# Patient Record
Sex: Male | Born: 1990 | Race: White | Hispanic: No | Marital: Single | Smoking: Current every day smoker
Health system: Southern US, Community
[De-identification: ages and names within clinical notes are randomized; demographics above are authoritative.]

## PROBLEM LIST (undated history)

## (undated) DIAGNOSIS — F1910 Other psychoactive substance abuse, uncomplicated: Secondary | ICD-10-CM

## (undated) DIAGNOSIS — N200 Calculus of kidney: Secondary | ICD-10-CM

## (undated) DIAGNOSIS — F988 Other specified behavioral and emotional disorders with onset usually occurring in childhood and adolescence: Secondary | ICD-10-CM

## (undated) DIAGNOSIS — F419 Anxiety disorder, unspecified: Secondary | ICD-10-CM

## (undated) DIAGNOSIS — F32A Depression, unspecified: Secondary | ICD-10-CM

## (undated) DIAGNOSIS — F329 Major depressive disorder, single episode, unspecified: Secondary | ICD-10-CM

## (undated) DIAGNOSIS — N289 Disorder of kidney and ureter, unspecified: Secondary | ICD-10-CM

## (undated) HISTORY — PX: DENTAL SURGERY: SHX609

## (undated) HISTORY — PX: OTHER SURGICAL HISTORY: SHX169

## (undated) HISTORY — DX: Other specified behavioral and emotional disorders with onset usually occurring in childhood and adolescence: F98.8

---
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
## 2009-06-16 ENCOUNTER — Emergency Department (HOSPITAL_BASED_OUTPATIENT_CLINIC_OR_DEPARTMENT_OTHER)
Admission: EM | Admit: 2009-06-16 | Discharge: 2009-06-16 | Payer: Self-pay | Source: Home / Self Care | Admitting: Emergency Medicine

## 2009-06-16 ENCOUNTER — Ambulatory Visit: Payer: Self-pay | Admitting: Diagnostic Radiology

## 2009-06-18 ENCOUNTER — Ambulatory Visit: Payer: Self-pay | Admitting: Pulmonary Disease

## 2009-06-18 DIAGNOSIS — J309 Allergic rhinitis, unspecified: Secondary | ICD-10-CM | POA: Insufficient documentation

## 2009-06-18 DIAGNOSIS — I10 Essential (primary) hypertension: Secondary | ICD-10-CM | POA: Insufficient documentation

## 2009-06-18 DIAGNOSIS — F17200 Nicotine dependence, unspecified, uncomplicated: Secondary | ICD-10-CM | POA: Insufficient documentation

## 2009-06-18 DIAGNOSIS — R042 Hemoptysis: Secondary | ICD-10-CM | POA: Insufficient documentation

## 2009-06-19 DIAGNOSIS — F411 Generalized anxiety disorder: Secondary | ICD-10-CM

## 2009-06-20 ENCOUNTER — Ambulatory Visit: Payer: Self-pay | Admitting: Internal Medicine

## 2009-10-28 ENCOUNTER — Emergency Department (HOSPITAL_COMMUNITY)
Admission: EM | Admit: 2009-10-28 | Discharge: 2009-10-28 | Payer: Self-pay | Source: Home / Self Care | Admitting: Emergency Medicine

## 2009-11-12 DEATH — deceased

## 2010-02-02 ENCOUNTER — Encounter: Payer: Self-pay | Admitting: Pulmonary Disease

## 2010-02-11 NOTE — Assessment & Plan Note (Signed)
Summary: hemoptysis/self ref./apc   Visit Type:  Initial Consult Copy to:  ER Dr. Nira Conn Primary Provider/Referring Provider:  Dr. Foy Guadalajara  CC:  Pt c/o coughing up blood x 3 days, PND, nasal congestion, and SOB with exertion.  History of Present Illness: 18/M smoker for evaluation of hemoptysis. He smokes marijuana & 2 PPd. He developed sudden onset bloodt inged sputum , the frank hemoptysis about 5-10 cc on two occasions on 06/16/09. ER visit, CXR was nml. Labs showed WC of 8.9, Hb 15.8, PT 12.7, BMET nml. He denies drug usage prior , denies bleeding gums or epistaxis. He does not have a h/o hematemesis or malena. He reports 1 episode of bloo din stool. His father dies of an MI at age 46, mother had an MI a few weeks ago.   Preventive Screening-Counseling & Management  Alcohol-Tobacco     Alcohol drinks/day: 1     Alcohol type: all     Smoking Status: current     Packs/Day: 2.0     Year Started: 2006     Year Quit: 2011  Current Medications (verified): 1)  Tessalon Perles 100 Mg Caps (Benzonatate) .... Take 1 Tablet By Mouth Two Times A Day 2)  Zpak .... As Directed 3)  Klonopin 1 Mg Tabs (Clonazepam) .... Take 1 Tablet By Mouth Two Times A Day and As Needed  Allergies (verified): 1)  ! Ceclor 2)  ! * Latex  Past History:  Past Medical History: Allergic Rhinitis Hypertension  Family History: Family History MI/Heart Attack-mother and father Father deceased age 60  Social History: Marital Status: single lives with mother Children: no Occupation:unemployeed  Patient is a current smoker.  Marijuana useSmoking Status:  current Packs/Day:  2.0 Alcohol drinks/day:  1  Review of Systems       The patient complains of shortness of breath with activity, productive cough, non-productive cough, coughing up blood, chest pain, acid heartburn, indigestion, abdominal pain, headaches, nasal congestion/difficulty breathing through nose, anxiety, and joint stiffness or pain.  The  patient denies shortness of breath at rest, irregular heartbeats, loss of appetite, weight change, difficulty swallowing, sore throat, tooth/dental problems, sneezing, itching, ear ache, depression, hand/feet swelling, rash, change in color of mucus, and fever.    Vital Signs:  Patient profile:   20 year old male Height:      73 inches Weight:      214 pounds BMI:     28.34 O2 Sat:      97 % on Room air Temp:     98.2 degrees F oral Pulse rate:   80 / minute BP sitting:   114 / 84  (left arm) Cuff size:   regular  Vitals Entered By: Zackery Barefoot CMA (June 18, 2009 11:41 AM)  O2 Flow:  Room air CC: Pt c/o coughing up blood x 3 days, PND, nasal congestion, SOB with exertion Comments Medications reviewed with patient Verified contact number and pharmacy with patient Zackery Barefoot CMA  June 18, 2009 11:42 AM    Physical Exam  Additional Exam:  Gen. Pleasant, well-nourished, in no distress, normal affect, ear piercings, tattoos ENT - no lesions, no post nasal drip Neck: No JVD, no thyromegaly, no carotid bruits Lungs: no use of accessory muscles, no dullness to percussion, clear without rales or rhonchi  Cardiovascular: Rhythm regular, heart sounds  normal, no murmurs or gallops, no peripheral edema Abdomen: soft and non-tender, no hepatosplenomegaly, BS normal. Musculoskeletal: No deformities, no cyanosis or clubbing Neuro:  alert, non focal     Impression & Recommendations:  Problem # 1:  HEMOPTYSIS (ICD-786.3) Assessment New No clear preceding URI/ bronchitic symptoms in this heavy smoker. CXR was nml but needs CT scan to r/o endobronchial pathology. Doubt cancer but benign lesions possible. If negative, can be attributed to smoking. Orders: Consultation Level III [id number]) Radiology Referral (Radiology)  Problem # 2:  TOBACCO ABUSE (ICD-305.1) Homero Fellers discussion about cessation.  He does not need aids such as patches or chanticx.  Medications Added to Medication  List This Visit: 1)  Tessalon Perles 100 Mg Caps (Benzonatate) .... Take 1 tablet by mouth two times a day 2)  Zpak  .... As directed 3)  Klonopin 1 Mg Tabs (Clonazepam) .... Take 1 tablet by mouth two times a day and as needed  Patient Instructions: 1)  Copy sent to: dr fried 2)  Please schedule a follow-up appointment as needed. 3)  A Chest CT WITHOUT Contrast has been recommended. Your imaging study may require preauthorization.

## 2010-03-26 LAB — POCT CARDIAC MARKERS
CKMB, poc: 1.3 ng/mL (ref 1.0–8.0)
Myoglobin, poc: 40.4 ng/mL (ref 12–200)
Troponin i, poc: <0.05 ng/mL (ref 0.00–0.09)
Troponin i, poc: <0.05 ng/mL (ref 0.00–0.09)

## 2010-03-26 LAB — BASIC METABOLIC PANEL
BUN: 15 mg/dL (ref 6–23)
Creatinine, Ser: 0.89 mg/dL (ref 0.4–1.5)
GFR calc Af Amer: >60 mL/min (ref >60)
GFR calc non Af Amer: >60 mL/min (ref >60)
Potassium: 4.4 mEq/L (ref 3.5–5.1)

## 2010-03-26 LAB — DIFFERENTIAL
Basophils Absolute: 0 10*3/uL (ref 0.0–0.1)
Lymphocytes Relative: 24 % (ref 12–46)
Lymphs Abs: 2.8 10*3/uL (ref 0.7–4.0)
Neutro Abs: 8.4 10*3/uL — ABNORMAL HIGH (ref 1.7–7.7)

## 2010-03-26 LAB — CBC
Platelets: 243 10*3/uL (ref 150–400)
RBC: 5.12 MIL/uL (ref 4.22–5.81)
RDW: 12.5 % (ref 11.5–15.5)
WBC: 11.9 10*3/uL — ABNORMAL HIGH (ref 4.0–10.5)

## 2010-03-31 LAB — DIFFERENTIAL
Eosinophils Absolute: 0.2 10*3/uL (ref 0.0–0.7)
Eosinophils Relative: 2 % (ref 0–5)
Lymphs Abs: 3 10*3/uL (ref 0.7–4.0)

## 2010-03-31 LAB — CBC
HCT: 46.5 % (ref 39.0–52.0)
MCHC: 34 g/dL (ref 30.0–36.0)
MCV: 89.7 fL (ref 78.0–100.0)
Platelets: 225 10*3/uL (ref 150–400)
WBC: 8.9 10*3/uL (ref 4.0–10.5)

## 2010-03-31 LAB — BASIC METABOLIC PANEL
BUN: 13 mg/dL (ref 6–23)
CO2: 25 mEq/L (ref 19–32)
Chloride: 109 mEq/L (ref 96–112)
Glucose, Bld: 96 mg/dL (ref 70–99)
Potassium: 4.6 mEq/L (ref 3.5–5.1)

## 2010-03-31 LAB — PROTIME-INR: Prothrombin Time: 12.7 seconds (ref 11.6–15.2)

## 2013-08-17 ENCOUNTER — Other Ambulatory Visit: Payer: Self-pay | Admitting: Orthopaedic Surgery

## 2013-08-17 DIAGNOSIS — M545 Low back pain, unspecified: Secondary | ICD-10-CM

## 2013-08-18 ENCOUNTER — Ambulatory Visit: Payer: Self-pay | Admitting: Family Medicine

## 2013-08-18 DIAGNOSIS — Z0289 Encounter for other administrative examinations: Secondary | ICD-10-CM

## 2013-08-19 ENCOUNTER — Ambulatory Visit
Admission: RE | Admit: 2013-08-19 | Discharge: 2013-08-19 | Disposition: A | Discharge status: Home / Self Care | Payer: BC Managed Care – PPO | Source: Ambulatory Visit | Attending: Orthopaedic Surgery | Admitting: Orthopaedic Surgery

## 2013-08-19 DIAGNOSIS — M545 Low back pain, unspecified: Secondary | ICD-10-CM

## 2013-08-22 ENCOUNTER — Telehealth: Payer: Self-pay | Admitting: Family Medicine

## 2013-08-22 NOTE — Telephone Encounter (Signed)
Noted  

## 2013-08-22 NOTE — Telephone Encounter (Signed)
Patient no showed for new pt appt on 8/7.  Please advise.

## 2013-11-16 ENCOUNTER — Emergency Department (HOSPITAL_COMMUNITY)
Admission: EM | Admit: 2013-11-16 | Discharge: 2013-11-16 | Disposition: A | Discharge status: Home / Self Care | Payer: BC Managed Care – PPO | Attending: Emergency Medicine | Admitting: Emergency Medicine

## 2013-11-16 ENCOUNTER — Encounter (HOSPITAL_COMMUNITY): Payer: Self-pay

## 2013-11-16 DIAGNOSIS — Z91040 Latex allergy status: Secondary | ICD-10-CM | POA: Diagnosis not present

## 2013-11-16 DIAGNOSIS — F32A Depression, unspecified: Secondary | ICD-10-CM

## 2013-11-16 DIAGNOSIS — F909 Attention-deficit hyperactivity disorder, unspecified type: Secondary | ICD-10-CM | POA: Insufficient documentation

## 2013-11-16 DIAGNOSIS — F329 Major depressive disorder, single episode, unspecified: Secondary | ICD-10-CM | POA: Insufficient documentation

## 2013-11-16 DIAGNOSIS — Z720 Tobacco use: Secondary | ICD-10-CM | POA: Insufficient documentation

## 2013-11-16 DIAGNOSIS — F419 Anxiety disorder, unspecified: Secondary | ICD-10-CM | POA: Diagnosis not present

## 2013-11-16 DIAGNOSIS — Z79899 Other long term (current) drug therapy: Secondary | ICD-10-CM | POA: Insufficient documentation

## 2013-11-16 HISTORY — DX: Depression, unspecified: F32.A

## 2013-11-16 HISTORY — DX: Major depressive disorder, single episode, unspecified: F32.9

## 2013-11-16 HISTORY — DX: Disorder of kidney and ureter, unspecified: N28.9

## 2013-11-16 HISTORY — DX: Anxiety disorder, unspecified: F41.9

## 2013-11-16 LAB — COMPREHENSIVE METABOLIC PANEL
ALBUMIN: 4 g/dL (ref 3.5–5.2)
ALK PHOS: 82 U/L (ref 39–117)
ALT: 15 U/L (ref 0–53)
AST: 14 U/L (ref 0–37)
Anion gap: 12 (ref 5–15)
BUN: 19 mg/dL (ref 6–23)
CALCIUM: 9.7 mg/dL (ref 8.4–10.5)
CO2: 25 mEq/L (ref 19–32)
Chloride: 104 mEq/L (ref 96–112)
Creatinine, Ser: 0.95 mg/dL (ref 0.50–1.35)
GFR calc Af Amer: >90 mL/min (ref >90)
GFR calc non Af Amer: >90 mL/min (ref >90)
GLUCOSE: 121 mg/dL — AB (ref 70–99)
POTASSIUM: 4.4 meq/L (ref 3.7–5.3)
SODIUM: 141 meq/L (ref 137–147)
TOTAL PROTEIN: 7.6 g/dL (ref 6.0–8.3)
Total Bilirubin: 0.4 mg/dL (ref 0.3–1.2)

## 2013-11-16 LAB — CBC
HCT: 41.7 % (ref 39.0–52.0)
HEMOGLOBIN: 14.2 g/dL (ref 13.0–17.0)
MCH: 30.3 pg (ref 26.0–34.0)
MCHC: 34.1 g/dL (ref 30.0–36.0)
MCV: 88.9 fL (ref 78.0–100.0)
PLATELETS: 229 10*3/uL (ref 150–400)
RBC: 4.69 MIL/uL (ref 4.22–5.81)
RDW: 13.1 % (ref 11.5–15.5)
WBC: 8.8 10*3/uL (ref 4.0–10.5)

## 2013-11-16 LAB — RAPID URINE DRUG SCREEN, HOSP PERFORMED
AMPHETAMINES: NOT DETECTED
BENZODIAZEPINES: POSITIVE — AB
Barbiturates: NOT DETECTED
COCAINE: NOT DETECTED
OPIATES: POSITIVE — AB
TETRAHYDROCANNABINOL: NOT DETECTED

## 2013-11-16 LAB — ETHANOL: Alcohol, Ethyl (B): <11 mg/dL (ref 0–11)

## 2013-11-16 LAB — ACETAMINOPHEN LEVEL

## 2013-11-16 LAB — SALICYLATE LEVEL

## 2013-11-16 NOTE — Discharge Instructions (Signed)
It was our pleasure to provide your ER care today - we hope that you feel better.  For mental health issues and/or crisis, follow up directly at [facility]. You may also use resource guide provided for additional community resources. Also follow up with primary care doctor in coming week.  Return to ER if worse, new symptoms, severe depression, thoughts of self harm, medical emergency, other concern.    Depression Depression refers to feeling sad, low, down in the dumps, blue, gloomy, or empty. In general, there are two kinds of depression:  Normal sadness or normal grief. This kind of depression is one that we all feel from time to time after upsetting life experiences, such as the loss of a job or the ending of a relationship. This kind of depression is considered normal, is short lived, and resolves within a few days to 2 weeks. Depression experienced after the loss of a loved one (bereavement) often lasts longer than 2 weeks but normally gets better with time.  Clinical depression. This kind of depression lasts longer than normal sadness or normal grief or interferes with your ability to function at home, at work, and in school. It also interferes with your personal relationships. It affects almost every aspect of your life. Clinical depression is an illness. Symptoms of depression can also be caused by conditions other than those mentioned above, such as:  Physical illness. Some physical illnesses, including underactive thyroid gland (hypothyroidism), severe anemia, specific types of cancer, diabetes, uncontrolled seizures, heart and lung problems, strokes, and chronic pain are commonly associated with symptoms of depression.  Side effects of some prescription medicine. In some people, certain types of medicine can cause symptoms of depression.  Substance abuse. Abuse of alcohol and illicit drugs can cause symptoms of depression. SYMPTOMS Symptoms of normal sadness and normal grief  include the following:  Feeling sad or crying for short periods of time.  Not caring about anything (apathy).  Difficulty sleeping or sleeping too much.  No longer able to enjoy the things you used to enjoy.  Desire to be by oneself all the time (social isolation).  Lack of energy or motivation.  Difficulty concentrating or remembering.  Change in appetite or weight.  Restlessness or agitation. Symptoms of clinical depression include the same symptoms of normal sadness or normal grief and also the following symptoms:  Feeling sad or crying all the time.  Feelings of guilt or worthlessness.  Feelings of hopelessness or helplessness.  Thoughts of suicide or the desire to harm yourself (suicidal ideation).  Loss of touch with reality (psychotic symptoms). Seeing or hearing things that are not real (hallucinations) or having false beliefs about your life or the people around you (delusions and paranoia). DIAGNOSIS  The diagnosis of clinical depression is usually based on how bad the symptoms are and how long they have lasted. Your health care provider will also ask you questions about your medical history and substance use to find out if physical illness, use of prescription medicine, or substance abuse is causing your depression. Your health care provider may also order blood tests. TREATMENT  Often, normal sadness and normal grief do not require treatment. However, sometimes antidepressant medicine is given for bereavement to ease the depressive symptoms until they resolve. The treatment for clinical depression depends on how bad the symptoms are but often includes antidepressant medicine, counseling with a mental health professional, or both. Your health care provider will help to determine what treatment is best for you. Depression  caused by physical illness usually goes away with appropriate medical treatment of the illness. If prescription medicine is causing depression, talk  with your health care provider about stopping the medicine, decreasing the dose, or changing to another medicine. Depression caused by the abuse of alcohol or illicit drugs goes away when you stop using these substances. Some adults need professional help in order to stop drinking or using drugs. SEEK IMMEDIATE MEDICAL CARE IF:  You have thoughts about hurting yourself or others.  You lose touch with reality (have psychotic symptoms).  You are taking medicine for depression and have a serious side effect. FOR MORE INFORMATION  National Alliance on Mental Illness: www.nami.[employer] of Mental Health: http://www.maynard.net/ Document Released: 12/27/1999 Document Revised: 05/15/2013 Document Reviewed: 03/30/2011 [facility] Patient Information 2015 [place], [place]. This information is not intended to replace advice given to you by your health care provider. Make sure you discuss any questions you have with your health care provider.     Generalized Anxiety Disorder Generalized anxiety disorder (GAD) is a mental disorder. It interferes with life functions, including relationships, work, and school. GAD is different from normal anxiety, which everyone experiences at some point in their lives in response to specific life events and activities. Normal anxiety actually helps [place] prepare for and get through these life events and activities. Normal anxiety goes away after the event or activity is over.  GAD causes anxiety that is not necessarily related to specific events or activities. It also causes excess anxiety in proportion to specific events or activities. The anxiety associated with GAD is also difficult to control. GAD can vary from mild to severe. People with severe GAD can have intense waves of anxiety with physical symptoms (panic attacks).  SYMPTOMS The anxiety and worry associated with GAD are difficult to control. This anxiety and worry are related to many life events and  activities and also occur more days than not for 6 months or longer. People with GAD also have three or more of the following symptoms (one or more in children):  Restlessness.   Fatigue.  Difficulty concentrating.   Irritability.  Muscle tension.  Difficulty sleeping or unsatisfying sleep. DIAGNOSIS GAD is diagnosed through an assessment by your health care provider. Your health care provider will ask you questions aboutyour mood,physical symptoms, and events in your life. Your health care provider may ask you about your medical history and use of alcohol or drugs, including prescription medicines. Your health care provider may also do a physical exam and blood tests. Certain medical conditions and the use of certain substances can cause symptoms similar to those associated with GAD. Your health care provider may refer you to a mental health specialist for further evaluation. TREATMENT The following therapies are usually used to treat GAD:   Medication. Antidepressant medication usually is prescribed for long-term daily control. Antianxiety medicines may be added in severe cases, especially when panic attacks occur.   Talk therapy (psychotherapy). Certain types of talk therapy can be helpful in treating GAD by providing support, education, and guidance. A form of talk therapy called cognitive behavioral therapy can teach you healthy ways to think about and react to daily life events and activities.  Stress managementtechniques. These include yoga, meditation, and exercise and can be very helpful when they are practiced regularly. A mental health specialist can help determine which treatment is best for you. Some people see improvement with one therapy. However, other people require a combination of therapies. Document Released: 04/25/2012  Document Revised: 05/15/2013 Document Reviewed: 04/25/2012 [facility] Patient Information 2015 [place], [place]. This information is not intended to  replace advice given to you by your health care provider. Make sure you discuss any questions you have with your health care provider.      Emergency Department Resource Guide 1) Find a Doctor and Pay Out of Pocket Although you won't have to find out who is covered by your insurance plan, it is a good idea to ask around and get recommendations. You will then need to call the office and see if the doctor you have chosen will accept you as a new patient and what types of options they offer for patients who are self-pay. Some doctors offer discounts or will set up payment plans for their patients who do not have insurance, but you will need to ask so you aren't surprised when you get to your appointment.  2) Contact Your Local Health Department Not all health departments have doctors that can see patients for sick visits, but many do, so it is worth a call to see if yours does. If you don't know where your local health department is, you can check in your phone book. The CDC also has a tool to help you locate your state's health department, and many state websites also have listings of all of their local health departments.  3) Find a Walk-in Clinic If your illness is not likely to be very severe or complicated, you may want to try a walk in clinic. These are popping up all over the country in pharmacies, drugstores, and shopping centers. They're usually staffed by nurse practitioners or physician assistants that have been trained to treat common illnesses and complaints. They're usually fairly quick and inexpensive. However, if you have serious medical issues or chronic medical problems, these are probably not your best option.  No Primary Care Doctor: - Call Health Connect at  [phone] - they can help you locate a primary care doctor that  accepts your insurance, provides certain services, etc. - Physician Referral Service- [phone]  Chronic Pain Problems: Organization          Address  Phone   Notes  [facility]  [phone] Patients need to be referred by their primary care doctor.   Medication Assistance: Organization         Address  Phone   Notes  [facility] Medication [facility] [address] [place] [place], [place] [address] [phone] --Must be a resident of [facility] -- Must have NO insurance coverage whatsoever (no Medicaid/ Medicare, etc.) -- The pt. MUST have a primary care doctor that directs their care regularly and follows them in the community   [facility]ist  [phone]   [employer]  [phone]    Agencies that provide inexpensive medical care: Organization         Address  Phone   Notes  [facility]  [phone]   [facility]    [phone]   [facility] [address] [place], [place] [address] [phone]   [facility] [address] [place] [place] [phone]   Planned Parenthood    [phone]   [facility]    [phone]   [facility]  [address], [place] Phone:  [phone], Fax:  [phone] Hours of Operation:  9 am - 6 pm, M-F.  Also accepts Medicaid/Medicare and self-pay.  [facility]  [address], [place] Phone: [phone], Fax: [phone]. Hours of Operation:  8:30 am - 5:30 pm, M-F.  Also accepts Medicaid and self-pay.  [facility] [place] [address], [place] Phone: [phone](336) [phone]   [facility] [address], [place], [place] [phone], Ext. [phone] Mondays & Thursdays: 7-9 AM.  First 15 patients are seen on a first come, first serve basis.    Medicaid-accepting [facility] [place] [place] Providers:  Organization         Address  Phone   Notes  [facility] [address], [place] [phone]) [phone] Also accepts self-pay patients.  [facility] [address], [place]  [phone]   [facility] [address], [place] [phone](336) [phone]   [facility] [address] [place] [address], [place] [phone]) [phone]   Renaye RakersVeita Bland [address], [place]   [phone](336) [phone] Only accepts [place] Access [place] patients after they have their name applied to their card.   Self-Pay (no insurance) in [facility]:  Organization         Address  Phone   Notes  Sickle Cell Patients, [facility] [place] [facility] [address], [place] [phone]) [phone]   [facility] [address], [place] [phone]) [phone]   [facility] [place]  [address], [place] [phone](336) [phone]   [facility]/Dr. Osei-Bonsu  [address] [place] [address], [place] or [phone], [place] [phone](336) [phone] Phone number for both [place] and [place] locations is the same.  [facility] [address], [place] [phone]   [facility]  [address] [place] [address], [place] or [address] [phone]   [facility] [address], [place] [phone]) [phone], phone; [phone], fax Sees patients 1st and 3rd Saturday of every month.  Must not qualify for public or private insurance (i.e. Medicaid, Medicare, [place] Health Choice, Veterans' Benefits)  Household income should be no more than 200% of the poverty level The clinic cannot treat you if you are pregnant or think you are pregnant  Sexually transmitted diseases are not treated at the clinic.    Dental Care: Organization         Address  Phone  Notes  [facility] of [facility] [address], [place] [phone]) [phone] Accepts children up to age 24 who are enrolled in [place] or [place] Health Choice; pregnant women with a Medicaid card; and  children who have applied for Medicaid or [place] Health Choice, but were declined, whose parents can pay a reduced fee at time of service.  [facility] [place]  [address], [place] [phone] Accepts children up to age 24 who are enrolled in [place] or [place] Health Choice; pregnant women with a Medicaid card; and children who have applied for Medicaid or  Health Choice, but were declined, whose parents can pay a reduced fee at time of service.  [facility]  [address], [place] [phone] Patients are seen by appointment only. Walk-ins are not accepted. [facility] will see patients 24 years of age and older. Monday - Tuesday (  8am-5pm) Most Wednesdays (8:30-5pm) $30 per visit, cash only  [facility] Access [facility]  [address], [facility] [phone] Patients are seen by appointment only. Walk-ins are not accepted. [facility] will see patients 59 years of age and older. One Wednesday Evening (Monthly: Volunteer Based).  $30 per visit, cash only  [employer]  [phone] for adults; Children under age 63, call Graduate Pediatric Dentistry at [phone]. Children aged 39-14, please call [phone] to request a pediatric application.  Dental services are provided in all areas of dental care including fillings, crowns and bridges, complete and partial dentures, implants, gum treatment, root canals, and extractions. Preventive care is also provided. Treatment is provided to both adults and children. Patients are selected via a lottery and there is often a waiting list.   [facility] [address] [place]  [phone] [email or web address]   [facility] [address] [place], [place] [phone], Ext. [phone] Second and Fourth Thursday of each month, opens at 6:30 AM; Clinic ends at 9 AM.  Patients are seen on a first-come first-served  basis, and a limited number are seen during each clinic.   [facility]  [address] [place] [place], [place] [phone]   Eligibility Requirements You must have lived in [place], [place], or [place] counties for at least the last three months.   You cannot be eligible for state or federal sponsored [place], including [employer], [place], or [employer].   You generally cannot be eligible for healthcare insurance through your employer.    How to apply: Eligibility screenings are held every Tuesday and Wednesday afternoon from 1:00 pm until 4:00 pm. You do not need an appointment for the interview!  [facility] [address], [place], [place] [phone]   [facility] Health Department  [phone]   [facility] Health Department  [phone]   [facility] Health Department  [phone]    Behavioral Health Resources in the Community: Intensive Outpatient Programs Organization         Address  Phone  Notes  [facility] Services [address], [place], [place] [phone]   [facility] Outpatient [address], [place], [place] [phone]   [facility] [address] [place], [place]  [phone]   [facility] Mental Health [address]  [place], [place] [phone] or [phone]   Substance Abuse Resources Organization         Address  Phone  Notes  [facility] Drug [facility]  [phone]   [facility]  [phone]   [facility] [place]  [phone]   Floydene Flock  [phone]   Residential & Outpatient Substance Abuse Program  [phone]   Psychological Services Organization         Address  Phone  Notes  [facility] Behavioral Health  [phone]   [facility] Services  [phone]   [facility] Mental Health [address], [place] [phone] or [phone]    Mobile Crisis Teams Organization          Address  Phone  Notes  [facility], Mobile Crisis Care Unit  [phone]   [facility]  [address] [place], [place] [phone]   Doristine Locks [address] [place] [phone]    Self-Help/Support Groups Organization         Address  Phone  Notes  [facility] [place] - variety of support groups  336- I7437963 Call for more information  Narcotics Anonymous (NA), [facility] [address], [employer] [place]  2 meetings at this location   Statistician         Address  Phone  Notes  [facility] [address],    [place]  [phone]   [facility]  [address], [place] [address], [place], [place] [phone]   [facility] Treatment Facility [address] [place], [place] [phone] Admissions: 8am-3pm M-F  [facility] [address]    [place], [place] [phone]   [facility] [address] [place], [place], [place] [phone]   [facility] [address],  [place], [place] [phone]   [facility] - Intensive Outpatient [address], [place], [place] [phone]   [facility] ([facility] [address]  [place], [place] [phone] or [phone]   [facility] (RTS) [address] [place], [place] [phone] Accepts Medicaid  [facility] [place] [address]  [place] [phone] Substance Abuse/Addiction Treatment   [facility] Organization         Address  Phone  Notes  [facility]  [phone]   Angie Fava, PhD [address] [place], [place]   [phone] or [phone]   [facility] Behavioral   [address] [place], [place] [phone]   [facility] [address], [place], [place] [phone] Insurance/Medicaid/sponsorship  through [facility] [address]                                    [place], [place] [phone] Therapy/tele-psych/case  [facility] [address], [place] [phone]    Dr. Lolly Mustache  [phone]   [facility] [place]  [employer] [facility] Dept. 1) [address], [place] 2) [address], [place] 3)  [address], [place] [phone]  [phone]   [facility] Child Abuse Hotline 214-676-0172 or [phone] (After Hours)

## 2013-11-16 NOTE — ED Notes (Signed)
Pt has one white bag of belongings with a pair of blue jeans, a white t-shirt, a pair of black socks, a brown belt, a pair or tennis shoes, a wallet with various cards and a small amount of cash, a cell phone, a set of keys, and a lighter. Pt has been changed into scrubs and yellow socks.

## 2013-11-16 NOTE — ED Notes (Addendum)
Pt presents with c/o anxiety and depression. Pt reports he has a hx of same as well as PTSD but his anxiety and depression has recently gotten much worse. Pt reports he is not currently on any medication for his depression. Pt reports some recent life stressors with job and finances. Pt denies any SI or HI.

## 2013-11-16 NOTE — ED Provider Notes (Signed)
CSN: [id number]     Arrival date & time 11/16/13  1519 History   First MD Initiated Contact with Patient 11/16/13 1528     Chief Complaint  Patient presents with  . Anxiety  . Depression     (Consider location/radiation/quality/duration/timing/severity/associated sxs/prior Treatment) Patient is a 23 y.o. male presenting with anxiety. The history is provided by the patient.  Anxiety Pertinent negatives include no chest pain, no abdominal pain, no headaches and no shortness of breath.  pt with hx ptsd, add, anxiety, and depression, states feeling more anxious and depressed in past few weeks. States has regular stress related to job and money issues. Denies any acute/new stressors. States chronic problems w insomnia, up with racing thoughts at night, and then will be tired and nap during day. Normal appetite. No nv. No wt change. Denies any recent physical illness or symptoms. States compliant w normal med, no recent change in meds/doses.  States was briefly on prozac in past for depression but that seemed to make him feel worse, w mood swings. Denies SI/HI. Denies problems w etoh or drug use, although acknowledges former problems w opiate abuse.      Past Medical History  Diagnosis Date  . ADD (attention deficit disorder)    History reviewed. No pertinent past surgical history. Family History  Problem Relation Age of Onset  . Heart attack Paternal Grandfather    History  Substance Use Topics  . Smoking status: Current Every Day Smoker  . Smokeless tobacco: Not on file  . Alcohol Use: Not on file    Review of Systems  Constitutional: Negative for fever.  HENT: Negative for sore throat.   Eyes: Negative for visual disturbance.  Respiratory: Negative for shortness of breath.   Cardiovascular: Negative for chest pain.  Gastrointestinal: Negative for vomiting and abdominal pain.  Genitourinary: Negative for flank pain.  Musculoskeletal: Negative for back pain and neck pain.   Skin: Negative for rash.  Neurological: Negative for headaches.  Hematological: Does not bruise/bleed easily.  Psychiatric/Behavioral: Positive for dysphoric mood. Negative for suicidal ideas. The patient is nervous/anxious.       Allergies  Cefaclor and Latex  Home Medications   Prior to Admission medications   Medication Sig Start Date End Date Taking? Authorizing Provider  lisdexamfetamine (VYVANSE) 50 MG capsule Take 50 mg by mouth daily.    Historical Provider, MD   BP 119/76 mmHg  Pulse 105  Temp(Src) 97.5 F (36.4 C) (Oral)  Resp 16  SpO2 98% Physical Exam  Constitutional: He is oriented to person, place, and time. He appears well-developed and well-nourished. No distress.  HENT:  Mouth/Throat: Oropharynx is clear and moist.  Eyes: Conjunctivae are normal. No scleral icterus.  Neck: Neck supple. No tracheal deviation present.  Cardiovascular: Normal rate, regular rhythm, normal heart sounds and intact distal pulses.   Pulmonary/Chest: Effort normal and breath sounds normal. No accessory muscle usage. No respiratory distress.  Abdominal: Soft. He exhibits no distension. There is no tenderness.  Musculoskeletal: Normal range of motion. He exhibits no edema.  Neurological: He is alert and oriented to person, place, and time.  Skin: Skin is warm and dry. He is not diaphoretic.  Psychiatric:  Depressed mood, denies SI.    Nursing note and vitals reviewed.   ED Course  Procedures (including critical care time) Labs Review  Results for orders placed or performed during the hospital encounter of 11/16/13  Acetaminophen level  Result Value Ref Range   Acetaminophen (Tylenol), Serum <15.0  10 - 30 ug/mL  CBC  Result Value Ref Range   WBC 8.8 4.0 - 10.5 K/uL   RBC 4.69 4.22 - 5.81 MIL/uL   Hemoglobin 14.2 13.0 - 17.0 g/dL   HCT 95.641.7 21.339.0 - 08.652.0 %   MCV 88.9 78.0 - 100.0 fL   MCH 30.3 26.0 - 34.0 pg   MCHC 34.1 30.0 - 36.0 g/dL   RDW 57.813.1 46.911.5 - 62.915.5 %    Platelets 229 150 - 400 K/uL  Comprehensive metabolic panel  Result Value Ref Range   Sodium 141 137 - 147 mEq/L   Potassium 4.4 3.7 - 5.3 mEq/L   Chloride 104 96 - 112 mEq/L   CO2 25 19 - 32 mEq/L   Glucose, Bld 121 (H) 70 - 99 mg/dL   BUN 19 6 - 23 mg/dL   Creatinine, Ser 5.280.95 0.50 - 1.35 mg/dL   Calcium 9.7 8.4 - 41.310.5 mg/dL   Total Protein 7.6 6.0 - 8.3 g/dL   Albumin 4.0 3.5 - 5.2 g/dL   AST 14 0 - 37 U/L   ALT 15 0 - 53 U/L   Alkaline Phosphatase 82 39 - 117 U/L   Total Bilirubin 0.4 0.3 - 1.2 mg/dL   GFR calc non Af Amer >90 >90 mL/min   GFR calc Af Amer >90 >90 mL/min   Anion gap 12 5 - 15  Ethanol (ETOH)  Result Value Ref Range   Alcohol, Ethyl (B) <11 0 - 11 mg/dL  Salicylate level  Result Value Ref Range   Salicylate Lvl <2.0 (L) 2.8 - 20.0 mg/dL  Urine Drug Screen  Result Value Ref Range   Opiates POSITIVE (A) NONE DETECTED   Cocaine NONE DETECTED NONE DETECTED   Benzodiazepines POSITIVE (A) NONE DETECTED   Amphetamines NONE DETECTED NONE DETECTED   Tetrahydrocannabinol NONE DETECTED NONE DETECTED   Barbiturates NONE DETECTED NONE DETECTED     MDM  Labs.   Psych team consulted.  Reviewed nursing notes and prior charts for additional history.   Called to psych ed as pt is requests d/c, does not want to stay for any inpatient tx or psych team eval.  Pt is alert, oriented x 3. Denies any thoughts of harm to self or others.  States he will follow up as outpatient.   Pt expresses clear thought process. No psychosis. Pt requests d/c from ED.     Suzi RootsKevin E Man Effertz, MD 11/16/13 [id number]

## 2013-11-16 NOTE — ED Notes (Signed)
Pt and belongings have been wanded by security.  

## 2013-11-16 NOTE — ED Notes (Signed)
Mother reports that she believes pt is being dishonest and that he has been using heroin recently. Pt denies any drug use.
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
## 2015-10-28 ENCOUNTER — Encounter (HOSPITAL_BASED_OUTPATIENT_CLINIC_OR_DEPARTMENT_OTHER): Payer: Self-pay

## 2015-10-28 ENCOUNTER — Inpatient Hospital Stay (HOSPITAL_COMMUNITY): Payer: BLUE CROSS/BLUE SHIELD

## 2015-10-28 ENCOUNTER — Inpatient Hospital Stay (HOSPITAL_BASED_OUTPATIENT_CLINIC_OR_DEPARTMENT_OTHER)
Admission: EM | Admit: 2015-10-28 | Discharge: 2015-10-31 | DRG: 854 | Disposition: A | Discharge status: Home / Self Care | Payer: BLUE CROSS/BLUE SHIELD | Attending: Internal Medicine | Admitting: Internal Medicine

## 2015-10-28 DIAGNOSIS — L02419 Cutaneous abscess of limb, unspecified: Secondary | ICD-10-CM

## 2015-10-28 DIAGNOSIS — F988 Other specified behavioral and emotional disorders with onset usually occurring in childhood and adolescence: Secondary | ICD-10-CM | POA: Diagnosis present

## 2015-10-28 DIAGNOSIS — F17200 Nicotine dependence, unspecified, uncomplicated: Secondary | ICD-10-CM

## 2015-10-28 DIAGNOSIS — L02413 Cutaneous abscess of right upper limb: Secondary | ICD-10-CM

## 2015-10-28 DIAGNOSIS — F419 Anxiety disorder, unspecified: Secondary | ICD-10-CM | POA: Diagnosis present

## 2015-10-28 DIAGNOSIS — L03113 Cellulitis of right upper limb: Secondary | ICD-10-CM | POA: Diagnosis present

## 2015-10-28 DIAGNOSIS — F17210 Nicotine dependence, cigarettes, uncomplicated: Secondary | ICD-10-CM | POA: Diagnosis present

## 2015-10-28 DIAGNOSIS — Z8249 Family history of ischemic heart disease and other diseases of the circulatory system: Secondary | ICD-10-CM | POA: Diagnosis not present

## 2015-10-28 DIAGNOSIS — A419 Sepsis, unspecified organism: Principal | ICD-10-CM

## 2015-10-28 DIAGNOSIS — M7989 Other specified soft tissue disorders: Secondary | ICD-10-CM

## 2015-10-28 DIAGNOSIS — F329 Major depressive disorder, single episode, unspecified: Secondary | ICD-10-CM | POA: Diagnosis present

## 2015-10-28 HISTORY — DX: Calculus of kidney: N20.0

## 2015-10-28 HISTORY — DX: Other psychoactive substance abuse, uncomplicated: F19.10

## 2015-10-28 LAB — COMPREHENSIVE METABOLIC PANEL
ALBUMIN: 4.3 g/dL (ref 3.5–5.0)
ALK PHOS: 69 U/L (ref 38–126)
ALT: 13 U/L — AB (ref 17–63)
ANION GAP: 9 (ref 5–15)
AST: 16 U/L (ref 15–41)
BUN: 15 mg/dL (ref 6–20)
CALCIUM: 9.3 mg/dL (ref 8.9–10.3)
CO2: 24 mmol/L (ref 22–32)
Chloride: 103 mmol/L (ref 101–111)
Creatinine, Ser: 0.97 mg/dL (ref 0.61–1.24)
GFR calc Af Amer: >60 mL/min (ref >60)
GFR calc non Af Amer: >60 mL/min (ref >60)
Glucose, Bld: 95 mg/dL (ref 65–99)
Potassium: 3.8 mmol/L (ref 3.5–5.1)
Sodium: 136 mmol/L (ref 135–145)
Total Bilirubin: 1 mg/dL (ref 0.3–1.2)
Total Protein: 8 g/dL (ref 6.5–8.1)

## 2015-10-28 LAB — CBC WITH DIFFERENTIAL/PLATELET
BASOS PCT: 0 %
Basophils Absolute: 0 10*3/uL (ref 0.0–0.1)
Eosinophils Absolute: 0.1 10*3/uL (ref 0.0–0.7)
Eosinophils Relative: 0 %
HCT: 39 % (ref 39.0–52.0)
Hemoglobin: 13.3 g/dL (ref 13.0–17.0)
LYMPHS PCT: 17 %
Lymphs Abs: 3 10*3/uL (ref 0.7–4.0)
MCH: 30.3 pg (ref 26.0–34.0)
MCHC: 34.1 g/dL (ref 30.0–36.0)
MCV: 88.8 fL (ref 78.0–100.0)
MONOS PCT: 9 %
Monocytes Absolute: 1.5 10*3/uL — ABNORMAL HIGH (ref 0.1–1.0)
Neutro Abs: 13 10*3/uL — ABNORMAL HIGH (ref 1.7–7.7)
Neutrophils Relative %: 74 %
Platelets: 244 10*3/uL (ref 150–400)
RBC: 4.39 MIL/uL (ref 4.22–5.81)
RDW: 12.9 % (ref 11.5–15.5)
WBC: 17.6 10*3/uL — ABNORMAL HIGH (ref 4.0–10.5)

## 2015-10-28 LAB — PROTIME-INR
INR: 1.22
Prothrombin Time: 15.4 seconds — ABNORMAL HIGH (ref 11.4–15.2)

## 2015-10-28 LAB — SEDIMENTATION RATE: SED RATE: 42 mm/h — AB (ref 0–16)

## 2015-10-28 LAB — C-REACTIVE PROTEIN: CRP: 17.3 mg/dL — ABNORMAL HIGH (ref <1.0)

## 2015-10-28 LAB — APTT: aPTT: 38 seconds — ABNORMAL HIGH (ref 24–36)

## 2015-10-28 LAB — PROCALCITONIN: Procalcitonin: 0.33 ng/mL

## 2015-10-28 LAB — LACTIC ACID, PLASMA
LACTIC ACID, VENOUS: 1 mmol/L (ref 0.5–1.9)
Lactic Acid, Venous: 0.8 mmol/L (ref 0.5–1.9)

## 2015-10-28 LAB — I-STAT CG4 LACTIC ACID, ED: Lactic Acid, Venous: 0.71 mmol/L (ref 0.5–1.9)

## 2015-10-28 MED ORDER — SODIUM CHLORIDE 0.9% FLUSH
3.0000 mL | Freq: Two times a day (BID) | INTRAVENOUS | Status: DC
  Administered 2015-10-28 – 2015-10-31 (×4): 3 mL via INTRAVENOUS

## 2015-10-28 MED ORDER — VANCOMYCIN HCL IN DEXTROSE 1-5 GM/200ML-% IV SOLN
1000.0000 mg | Freq: Three times a day (TID) | INTRAVENOUS | Status: DC
  Administered 2015-10-29: 1000 mg via INTRAVENOUS
  Filled 2015-10-28 (×3): qty 200

## 2015-10-28 MED ORDER — ACETAMINOPHEN 325 MG PO TABS
ORAL_TABLET | ORAL | Status: AC
  Filled 2015-10-28: qty 2

## 2015-10-28 MED ORDER — PIPERACILLIN-TAZOBACTAM 3.375 G IVPB
3.3750 g | Freq: Three times a day (TID) | INTRAVENOUS | Status: DC
  Administered 2015-10-28 – 2015-10-31 (×8): 3.375 g via INTRAVENOUS
  Filled 2015-10-28 (×10): qty 50

## 2015-10-28 MED ORDER — ALPRAZOLAM 0.25 MG PO TABS
0.2500 mg | ORAL_TABLET | Freq: Two times a day (BID) | ORAL | Status: DC | PRN
  Administered 2015-10-28 – 2015-10-31 (×4): 0.25 mg via ORAL
  Filled 2015-10-28 (×4): qty 1

## 2015-10-28 MED ORDER — HYDROMORPHONE HCL 1 MG/ML IJ SOLN
1.0000 mg | Freq: Once | INTRAMUSCULAR | Status: AC
  Administered 2015-10-28: 1 mg via INTRAVENOUS
  Filled 2015-10-28: qty 1

## 2015-10-28 MED ORDER — ACETAMINOPHEN 325 MG PO TABS: 650.0000 mg | ORAL_TABLET | Freq: Four times a day (QID) | ORAL | Status: DC | PRN

## 2015-10-28 MED ORDER — PIPERACILLIN-TAZOBACTAM 3.375 G IVPB 30 MIN: 3.3750 g | Freq: Three times a day (TID) | INTRAVENOUS | Status: DC

## 2015-10-28 MED ORDER — PIPERACILLIN-TAZOBACTAM 3.375 G IVPB 30 MIN
3.3750 g | Freq: Once | INTRAVENOUS | Status: AC
  Administered 2015-10-28: 3.375 g via INTRAVENOUS
  Filled 2015-10-28 (×2): qty 50

## 2015-10-28 MED ORDER — GADOBENATE DIMEGLUMINE 529 MG/ML IV SOLN
20.0000 mL | Freq: Once | INTRAVENOUS | Status: AC | PRN
  Administered 2015-10-28: 20 mL via INTRAVENOUS

## 2015-10-28 MED ORDER — OXYCODONE-ACETAMINOPHEN 5-325 MG PO TABS
1.0000 | ORAL_TABLET | Freq: Once | ORAL | Status: AC
  Administered 2015-10-28: 1 via ORAL
  Filled 2015-10-28: qty 1

## 2015-10-28 MED ORDER — VANCOMYCIN HCL 500 MG IV SOLR
500.0000 mg | Freq: Once | INTRAVENOUS | Status: AC
  Administered 2015-10-29: 500 mg via INTRAVENOUS
  Filled 2015-10-28: qty 500

## 2015-10-28 MED ORDER — SODIUM CHLORIDE 0.9 % IV BOLUS (SEPSIS)
1000.0000 mL | Freq: Once | INTRAVENOUS | Status: AC
  Administered 2015-10-28: 1000 mL via INTRAVENOUS

## 2015-10-28 MED ORDER — VANCOMYCIN HCL IN DEXTROSE 1-5 GM/200ML-% IV SOLN
1000.0000 mg | Freq: Once | INTRAVENOUS | Status: AC
  Administered 2015-10-28: 1000 mg via INTRAVENOUS
  Filled 2015-10-28: qty 200

## 2015-10-28 MED ORDER — DIPHENHYDRAMINE HCL 25 MG PO CAPS
25.0000 mg | ORAL_CAPSULE | Freq: Four times a day (QID) | ORAL | Status: DC | PRN
  Filled 2015-10-28: qty 1

## 2015-10-28 MED ORDER — LIDOCAINE HCL (PF) 1 % IJ SOLN
5.0000 mL | Freq: Once | INTRAMUSCULAR | Status: AC
  Administered 2015-10-28: 5 mL via INTRADERMAL
  Filled 2015-10-28: qty 5

## 2015-10-28 MED ORDER — MORPHINE SULFATE (PF) 2 MG/ML IV SOLN
2.0000 mg | INTRAVENOUS | Status: DC | PRN
  Administered 2015-10-28 – 2015-10-31 (×10): 2 mg via INTRAVENOUS
  Filled 2015-10-28 (×12): qty 1

## 2015-10-28 MED ORDER — ACETAMINOPHEN 325 MG PO TABS
650.0000 mg | ORAL_TABLET | Freq: Once | ORAL | Status: AC
Start: 2015-10-28 — End: 2015-10-28
  Administered 2015-10-28: 650 mg via ORAL

## 2015-10-28 MED ORDER — SODIUM CHLORIDE 0.9 % IV SOLN
INTRAVENOUS | Status: DC
  Administered 2015-10-28 – 2015-10-30 (×4): via INTRAVENOUS

## 2015-10-28 MED ORDER — IBUPROFEN 200 MG PO TABS
200.0000 mg | ORAL_TABLET | Freq: Three times a day (TID) | ORAL | Status: DC
  Administered 2015-10-28 – 2015-10-31 (×7): 200 mg via ORAL
  Filled 2015-10-28 (×9): qty 1

## 2015-10-28 MED ORDER — ZOLPIDEM TARTRATE 5 MG PO TABS
5.0000 mg | ORAL_TABLET | Freq: Every evening | ORAL | Status: DC | PRN
Start: 2015-10-28 — End: 2015-10-31
  Filled 2015-10-28: qty 1

## 2015-10-28 MED ORDER — OXYCODONE-ACETAMINOPHEN 5-325 MG PO TABS
1.0000 | ORAL_TABLET | ORAL | Status: DC | PRN
  Administered 2015-10-28 – 2015-10-31 (×8): 1 via ORAL
  Filled 2015-10-28 (×8): qty 1

## 2015-10-28 MED ORDER — SODIUM CHLORIDE 0.9 % IV BOLUS (SEPSIS)
1500.0000 mL | Freq: Once | INTRAVENOUS | Status: AC
  Administered 2015-10-28: 1500 mL via INTRAVENOUS

## 2015-10-28 MED ORDER — NICOTINE 21 MG/24HR TD PT24
21.0000 mg | MEDICATED_PATCH | Freq: Every day | TRANSDERMAL | Status: DC
  Filled 2015-10-28 (×4): qty 1

## 2015-10-28 MED ORDER — DIPHENHYDRAMINE HCL 25 MG PO CAPS
25.0000 mg | ORAL_CAPSULE | Freq: Once | ORAL | Status: AC
  Administered 2015-10-29: 25 mg via ORAL

## 2015-10-28 MED ORDER — ONDANSETRON HCL 4 MG/2ML IJ SOLN
4.0000 mg | Freq: Three times a day (TID) | INTRAMUSCULAR | Status: DC | PRN
  Administered 2015-10-29: 4 mg via INTRAVENOUS

## 2015-10-28 NOTE — H&P (Signed)
History and Physical    [name] DQQ:[id number] DOB: 1991/01/01 DOA: 10/28/2015  Referring MD/NP/PA:   PCP: No primary care provider on file.   Patient coming from:  The patient is coming from home.  At baseline, pt is independent for most of ADL.   Chief Complaint: right arm and elebow pain  HPI: Paul Shah is a 25 y.o. male with medical history significant of IV drug user (heroin), depression, anxiety, ADD, kidney stone, tobacco abuse, who presents with right arm and elbow pain.  Patient states that she started having right arm and elbow pain 3 days ago, which has been progressively getting worse. The pain is worst at  the medial elbow area, extending to both lower and upper arms. It is swelling, milly erythematous, and tender. It is associated with fever and chills. He had fever of 101 at home today. He took two doses of antibiotics (unknown name) on the first day and one dose yesterday without help. His arm pain is constant, sharp, 10 out of 10 severity, nonradiating. It is aggravated by movement. Patient has nausea, but no vomiting, diarrhea or abdominal pain. He denies chest pain, shortness breath, cough, symptoms of UTI or unilateral weakness. Pt endorses IV drug use at the day prior to onset of symptoms. Superificial ID was performed by EDP.   ED Course: pt was found to have WBC 17.6, lactate 0.71, temperature 100.5, tachycardia, tachypnea, electrolytes and renal function okay. Patient is admitted to MedSurg bed as inpt. Ortho, Dr. Percell Miller was consulted-->possible surgery on 10/29/2015.   Review of Systems:    General: has fevers, chills, no changes in body weight, has fatigue HEENT: no blurry vision, hearing changes or sore throat Respiratory: no dyspnea, coughing, wheezing CV: no chest pain, no palpitations GI: has nausea, no vomiting, abdominal pain, diarrhea, constipation GU: no dysuria, burning on urination, increased urinary frequency, hematuria  Ext: no leg  edema Neuro: no unilateral weakness, numbness, or tingling, no vision change or hearing loss Skin: no rash, no skin tear. MSK: No muscle spasm, no deformity, no limitation of range of movement in spin Heme: No easy bruising.  Travel history: No recent long distant travel.  Allergy:  Allergies  Allergen Reactions  . Flexeril [Cyclobenzaprine] Hives and Shortness Of Breath  . Cefaclor     REACTION: rash  . Gadolinium Derivatives Itching    Itchy ears throat and legs   . Latex     REACTION: rash  . Topiramate Itching and Swelling    Swelling of nose and face. Numbness in legs and toes.     Past Medical History:  Diagnosis Date  . ADD (attention deficit disorder)   . Anxiety   . Depression   . IV drug abuse   . Kidney stone   . Renal disorder     Past Surgical History:  Procedure Laterality Date  . DENTAL SURGERY      Social History:  reports that he has been smoking Cigarettes.  He has never used smokeless tobacco. He reports that he has current or past drug history, including IV. He reports that he does not drink alcohol.  Family History:  Family History  Problem Relation Age of Onset  . Heart attack Paternal Grandfather      Prior to Admission medications   Medication Sig Start Date End Date Taking? Authorizing Provider  ibuprofen (ADVIL,MOTRIN) 200 MG tablet Take 200 mg by mouth every 6 (six) hours as needed for moderate pain.   Yes Historical Provider,  MD    Physical Exam: Vitals:   10/28/15 1454 10/28/15 1700 10/28/15 1744 10/28/15 1939  BP: 126/74 113/74 115/80 131/75  Pulse: 92 96 104 96  Resp: '18 22 16 18  '$ Temp:   100.5 F (38.1 C) 100 F (37.8 C)  TempSrc:    Oral  SpO2: 98% 100% 98% 99%  Weight:    94.8 kg (209 lb 1.6 oz)  Height:    '6\' 1"'$  (1.854 m)   General: Not in acute distress HEENT:       Eyes: PERRL, EOMI, no scleral icterus.       ENT: No discharge from the ears and nose, no pharynx injection, no tonsillar enlargement.        Neck: No  JVD, no bruit, no mass felt. Heme: No neck lymph node enlargement. Cardiac: S1/S2, RRR, No murmurs, No gallops or rubs. Respiratory: Good air movement bilaterally. No rales, wheezing, rhonchi or rubs. GI: Soft, nondistended, nontender, no rebound pain, no organomegaly, BS present. GU: No hematuria Ext: No pitting leg edema bilaterally. 2+DP/PT pulse bilaterally. Has swelling, redness, warmth and tenderness in right elbow area, extending to both lower and upper arms.  Musculoskeletal: No joint deformities, No joint redness or warmth, no limitation of ROM in spin. Skin: No rashes.  Neuro: Alert, oriented X3, cranial nerves II-XII grossly intact, moves all extremities normally.   Psych: Patient is not psychotic, no suicidal or hemocidal ideation.  Labs on Admission: I have personally reviewed following labs and imaging studies  CBC:  Recent Labs Lab 10/28/15 1405  WBC 17.6*  NEUTROABS 13.0*  HGB 13.3  HCT 39.0  MCV 88.8  PLT 379   Basic Metabolic Panel:  Recent Labs Lab 10/28/15 1405  NA 136  K 3.8  CL 103  CO2 24  GLUCOSE 95  BUN 15  CREATININE 0.97  CALCIUM 9.3   GFR: Estimated Creatinine Clearance: 130.4 mL/min (by C-G formula based on SCr of 0.97 mg/dL). Liver Function Tests:  Recent Labs Lab 10/28/15 1405  AST 16  ALT 13*  ALKPHOS 69  BILITOT 1.0  PROT 8.0  ALBUMIN 4.3   No results for input(s): LIPASE, AMYLASE in the last 168 hours. No results for input(s): AMMONIA in the last 168 hours. Coagulation Profile:  Recent Labs Lab 10/28/15 2018  INR 1.22   Cardiac Enzymes: No results for input(s): CKTOTAL, CKMB, CKMBINDEX, TROPONINI in the last 168 hours. BNP (last 3 results) No results for input(s): PROBNP in the last 8760 hours. HbA1C: No results for input(s): HGBA1C in the last 72 hours. CBG: No results for input(s): GLUCAP in the last 168 hours. Lipid Profile: No results for input(s): CHOL, HDL, LDLCALC, TRIG, CHOLHDL, LDLDIRECT in the last 72  hours. Thyroid Function Tests: No results for input(s): TSH, T4TOTAL, FREET4, T3FREE, THYROIDAB in the last 72 hours. Anemia Panel: No results for input(s): VITAMINB12, FOLATE, FERRITIN, TIBC, IRON, RETICCTPCT in the last 72 hours. Urine analysis: No results found for: COLORURINE, APPEARANCEUR, LABSPEC, PHURINE, GLUCOSEU, HGBUR, BILIRUBINUR, KETONESUR, PROTEINUR, UROBILINOGEN, NITRITE, LEUKOCYTESUR Sepsis Labs: '@LABRCNTIP'$ (procalcitonin:4,lacticidven:4) ) Recent Results (from the past 240 hour(s))  Wound or Superficial Culture     Status: None (Preliminary result)   Collection Time: 10/28/15  4:17 PM  Result Value Ref Range Status   Specimen Description ABSCESS RIGHT ARM  Final   Special Requests NONE  Final   Gram Stain   Final    FEW WBC PRESENT, PREDOMINANTLY PMN FEW GRAM POSITIVE COCCI IN CLUSTERS IN PAIRS RARE GRAM  NEGATIVE RODS Performed at [facility]    Culture PENDING  Incomplete   Report Status PENDING  Incomplete  Surgical pcr screen     Status: Abnormal   Collection Time: 10/28/15 11:50 PM  Result Value Ref Range Status   MRSA, PCR NEGATIVE NEGATIVE Final   Staphylococcus aureus POSITIVE (A) NEGATIVE Final    Comment:        The Xpert SA Assay (FDA approved for NASAL specimens in patients over 47 years of age), is one component of a comprehensive surveillance program.  Test performance has been validated by [facility] for patients greater than or equal to 48 year old. It is not intended to diagnose infection nor to guide or monitor treatment.      Radiological Exams on Admission: Dg Chest 1 View  Result Date: 10/28/2015 CLINICAL DATA:  Acute onset of right arm infection. Initial encounter. EXAM: CHEST 1 VIEW COMPARISON:  Chest radiograph performed 10/28/2009 FINDINGS: The lungs are well-aerated and clear. There is no evidence of focal opacification, pleural effusion or pneumothorax. The cardiomediastinal silhouette is within normal limits. No acute  osseous abnormalities are seen. IMPRESSION: No acute cardiopulmonary process seen. Electronically Signed   By: Garald Balding M.D.   On: 10/28/2015 23:47    EKG:  Not done in ED, will get one.   Assessment/Plan Principal Problem:   Right arm cellulitis Active Problems:   TOBACCO ABUSE   Abscess of arm   Sepsis (HCC)   Anxiety   Right arm cellulitis and abscess and sepsis: pt is septic with leukocytosis, fever, tachycardia and tachypnea. Hemodynamically stable. Lactate is normal 0.71. This is possibly due to IV drug injection. Ortho, Dr. Percell Miller was consulted.  - will admit to tele bed - Empiric antimicrobial treatment with vancomycin and Zosyn per pharmacy - PRN Zofran for nausea, and Percocet and ibuprofen for pain - Blood cultures x 2  - ESR and CRP - MRI-elbow  - will get Procalcitonin and trend lactic acid levels per sepsis protocol. - IVF: 2.5 L of NS bolus in ED, followed by 100 cc/h -check right upper arm doppler to r/o DVT - f/u ortho recommendations  Tobacco abuse: -Did counseling about importance of quitting smoking -Nicotine patch  Anxiety: -Prn Xanax    Sepsis - Aeassessment   Performed at:    5:29 AM   Last Vitals:    Blood pressure 115/80, pulse 104, temperature 100.5 F (38.1 C), resp. rate 16, height '6\' 1"'$  (1.854 m), weight 95.3 kg (210 lb), SpO2 98 %.  Heart:                 Regular rhythm Tachchycarida  Lungs:     Clear to auscultation  Capillary Refill:   <2 seconds    Peripheral Pulse (include location): Brachial pulse palpable   Skin (include color):              Normal    DVT ppx: SCD Code Status: Full code Family Communication: Yes, patient's fianc at bed side Disposition Plan:  Anticipate discharge back to previous home environment Consults called:  Ortho. Dr. Percell Miller Admission status: Inpatient/med-surg      Date of Service 10/29/2015    Ivor Costa [facility] Pager [phone]  If 7PM-7AM, please contact  night-coverage [email or web address] Password [email or web address] 10/29/2015, 5:29 AM

## 2015-10-28 NOTE — ED Notes (Signed)
MD at bedside. 

## 2015-10-28 NOTE — ED Notes (Signed)
Pt leaving with [facility] at this time, pt alert and oriented.

## 2015-10-28 NOTE — ED Triage Notes (Addendum)
C/o redness to right AC x 3 days-hx of IV drug use-none x 6 months-states he had an area there that felt like a knot x 6 months with no redness-NAD-steady gait-[facility]

## 2015-10-28 NOTE — Progress Notes (Signed)
  [facility] significant cellul[facility] and abscess of the right hand and arm. ID performed by EDP. Patient prior to coming in endorses IV drug use the day prior to onset of symptoms. Self treated with antibiotics at home without success. Orthopedic surgery, Dr. Renaye Rakersim Murphy, was consult at the request admission to the skilled hospital for continued IV antibiotics and possible surgery on 10/29/2015. Of note patient will need an MRI after arrival, hospital and will need to be nothing by mouth after midnight for possible surgery on 10/29/2015. Patient accepted to [facility].  Shelly Flattenavid Merrell, MD [facility] 10/28/2015, 4:25 PM

## 2015-10-28 NOTE — ED Notes (Signed)
Report given to Meagan, RN 

## 2015-10-28 NOTE — Progress Notes (Signed)
Pharmacy Antibiotic Note  Paul BoerDaniel Shah is a 25 y.o. male admitted on 10/28/2015 with right arm cellulitis.  Pharmacy has been consulted for vancomycin dosing. Renal function wnl. Vancomycin 1g given in the ED.  Vancomycin trough goal 10-15  Plan: 1) Give extra 500mg  vancomycin to complete 1500mg  loading dose 2) Then vancomycin 1g q8 3) Continue zosyn 3.375g q8 (EI) as ordered 4) Follow renal function, cultures, LOT, level as needed  Height: 6\' 1"  (185.4 cm) Weight: 209 lb 1.6 oz (94.8 kg) IBW/kg (Calculated) : 79.9  Temp (24hrs), Avg:99.6 F (37.6 C), Min:98.3 F (36.8 C), Max:100.5 F (38.1 C)   Recent Labs Lab 10/28/15 1405 10/28/15 1410  WBC 17.6*  --   CREATININE 0.97  --   LATICACIDVEN  --  0.71    Estimated Creatinine Clearance: 130.4 mL/min (by C-G formula based on SCr of 0.97 mg/dL).    Allergies  Allergen Reactions  . Flexeril [Cyclobenzaprine] Hives and Shortness Of Breath  . Cefaclor     REACTION: rash  . Latex     REACTION: rash  . Topiramate Itching and Swelling    Swelling of nose and face. Numbness in legs and toes.     Antimicrobials this admission: 10/16 Vancomycin >> 10/16 Zosyn >>  Dose adjustments this admission: n/a  Microbiology results: 10/16 blood x1>> 10/16 right arm abscess >>  Thank you for allowing pharmacy to be a part of this patient's care.  Paul Shah, Paul Shah 10/28/2015 8:26 PM

## 2015-10-28 NOTE — ED Notes (Signed)
Notified Dr. Madilyn Hookees that second blood culture was unable to be collected at this time, okay to start antibiotics at this time.

## 2015-10-28 NOTE — ED Provider Notes (Signed)
[facility] Provider Note   CSN: [id number] Arrival date & time: 10/28/15  1223     History   Chief Complaint Chief Complaint  Patient presents with  . Arm Pain    HPI Paul Shah is a 25 y.o. male.  The history is provided by the patient. No language interpreter was used.   Paul Shah is a 25 y.o. male who presents to the Emergency Department complaining of arm pain.  He has a hx/o IVDA (heroin), last use on Friday.  On Saturday morning he awoke with pain and swelling to the left arm.  He took two doses of antibiotics (unknown name) that day and one dose yesterday.  His arm continues to swell and he has difficulty moving it.  He had fevers last night to 101.  Sxs are severe, constant, worsening.  He is right hand dominant.    Past Medical History:  Diagnosis Date  . ADD (attention deficit disorder)   . Anxiety   . Depression   . IV drug abuse   . Kidney stone   . Renal disorder     Patient Active Problem List   Diagnosis Date Noted  . GENERALIZED ANXIETY DISORDER 06/19/2009  . TOBACCO ABUSE 06/18/2009  . HYPERTENSION 06/18/2009  . ALLERGIC RHINITIS 06/18/2009  . HEMOPTYSIS 06/18/2009    History reviewed. No pertinent surgical history.     Home Medications    Prior to Admission medications   Not on File    Family History Family History  Problem Relation Age of Onset  . Heart attack Paternal Grandfather     Social History Social History  Substance Use Topics  . Smoking status: Current Every Day Smoker    Types: Cigarettes  . Smokeless tobacco: Never Used  . Alcohol use No     Allergies   Flexeril [cyclobenzaprine]; Cefaclor; Latex; and Topiramate   Review of Systems Review of Systems  All other systems reviewed and are negative.    Physical Exam Updated Vital Signs BP 149/93 (BP Location: Left Arm)   Pulse 103   Temp 98.3 F (36.8 C) (Oral)   Resp 18   Ht 6\' 1"  (1.854 m)   Wt 210 lb (95.3 kg)   SpO2 98%   BMI  27.71 kg/m   Physical Exam  Constitutional: He is oriented to person, place, and time. He appears well-developed and well-nourished.  HENT:  Head: Normocephalic and atraumatic.  Cardiovascular: Normal rate and regular rhythm.   No murmur heard. Pulmonary/Chest: Effort normal and breath sounds normal. No respiratory distress.  Abdominal: Soft. There is no tenderness. There is no rebound and no guarding.  Musculoskeletal:  2+ radial pulses bilaterally.  Marked erythema and induration to the right arm from the [facility] fossa to the wrist.  Pt is unable to extend elbow fully secondary to edema.  Edema extends into upper arm.  Point of maximal fluctuance and tenderness is just over the [facility] fossa.    Neurological: He is alert and oriented to person, place, and time.  Skin: Skin is warm and dry.  Psychiatric: He has a normal mood and affect. His behavior is normal.  Nursing note and vitals reviewed.    ED Treatments / Results  Labs (all labs ordered are listed, but only abnormal results are displayed) Labs Reviewed  COMPREHENSIVE METABOLIC PANEL  CBC WITH DIFFERENTIAL/PLATELET  I-STAT CG4 LACTIC ACID, ED    EKG  EKG Interpretation None       Radiology No results found.  Procedures .Marland Kitchen.Incision and Drainage Date/Time: 10/28/2015 4:26 PM Performed by: Tilden FossaEES, Kaedin Hicklin Authorized by: Tilden FossaEES, Naketa Daddario   Consent:    Consent obtained:  Verbal   Consent given by:  Patient   Risks discussed:  Bleeding, incomplete drainage, infection and pain Location:    Type:  Abscess   Location:  Upper extremity   Upper extremity location: right AC fossa. Pre-procedure details:    Skin preparation:  Betadine Anesthesia (see MAR for exact dosages):    Anesthesia method:  Local infiltration   Local anesthetic:  Lidocaine 1% w/o epi Procedure type:    Complexity:  Simple Procedure details:    Incision types:  Stab incision   Scalpel blade:  11   Drainage:  Purulent and bloody   Drainage amount:   Copious   Wound treatment:  Wound left open   Packing materials:  None Post-procedure details:    Patient tolerance of procedure:  Tolerated well, no immediate complications   (including critical care time)  Medications Ordered in ED Medications - No data to display   Initial Impression / Assessment and Plan / ED Course  I have reviewed the triage vital signs and the nursing notes.  Pertinent labs & imaging results that were available during my care of the patient were reviewed by me and considered in my medical decision making (see chart for details).  Clinical Course   Pt here with abscess and cellulitis to the RUE.  D/w Dr. Eulah PontMurphy with Ortho who recommends admission to medicine service for IV abx, npo after midnight and bedside superficial I and D if possible.  D/w Dr. Konrad DoloresMerrell with Hospitalist service who agrees to admit the patient.  D/w pt findings of studies and recommendation for admission and pt is in agreement with plan.    Bedside I and D performed per procedure note with return of large amount of purulent material  Final Clinical Impressions(s) / ED Diagnoses   Final diagnoses:  Abscess of right upper extremity  Sepsis (HCC)  Swelling of upper arm  Right arm cellulitis    New Prescriptions New Prescriptions   No medications on file     Tilden FossaElizabeth Berman Grainger, MD 10/29/15 [phone]

## 2015-10-29 ENCOUNTER — Encounter (HOSPITAL_COMMUNITY)
Admission: EM | Disposition: A | Discharge status: Home / Self Care | Payer: Self-pay | Source: Home / Self Care | Attending: Internal Medicine

## 2015-10-29 ENCOUNTER — Encounter (HOSPITAL_COMMUNITY): Payer: Self-pay | Admitting: Internal Medicine

## 2015-10-29 ENCOUNTER — Inpatient Hospital Stay (HOSPITAL_COMMUNITY): Payer: BLUE CROSS/BLUE SHIELD | Admitting: Certified Registered Nurse Anesthetist

## 2015-10-29 ENCOUNTER — Inpatient Hospital Stay: Admit: 2015-10-29 | Payer: BLUE CROSS/BLUE SHIELD | Admitting: Orthopedic Surgery

## 2015-10-29 ENCOUNTER — Encounter (HOSPITAL_COMMUNITY): Payer: BLUE CROSS/BLUE SHIELD

## 2015-10-29 HISTORY — PX: I & D EXTREMITY: SHX5045

## 2015-10-29 LAB — BASIC METABOLIC PANEL
ANION GAP: 8 (ref 5–15)
BUN: 6 mg/dL (ref 6–20)
CO2: 24 mmol/L (ref 22–32)
Calcium: 8.9 mg/dL (ref 8.9–10.3)
Chloride: 107 mmol/L (ref 101–111)
Creatinine, Ser: 0.91 mg/dL (ref 0.61–1.24)
GLUCOSE: 88 mg/dL (ref 65–99)
POTASSIUM: 4.6 mmol/L (ref 3.5–5.1)
Sodium: 139 mmol/L (ref 135–145)

## 2015-10-29 LAB — RAPID URINE DRUG SCREEN, HOSP PERFORMED
AMPHETAMINES: NOT DETECTED
BARBITURATES: NOT DETECTED
BENZODIAZEPINES: NOT DETECTED
COCAINE: NOT DETECTED
Opiates: POSITIVE — AB
Tetrahydrocannabinol: NOT DETECTED

## 2015-10-29 LAB — CBC
HEMATOCRIT: 36.1 % — AB (ref 39.0–52.0)
HEMOGLOBIN: 12 g/dL — AB (ref 13.0–17.0)
MCH: 29.7 pg (ref 26.0–34.0)
MCHC: 33.2 g/dL (ref 30.0–36.0)
MCV: 89.4 fL (ref 78.0–100.0)
Platelets: 207 10*3/uL (ref 150–400)
RBC: 4.04 MIL/uL — AB (ref 4.22–5.81)
RDW: 13.1 % (ref 11.5–15.5)
WBC: 15.2 10*3/uL — AB (ref 4.0–10.5)

## 2015-10-29 LAB — HIV ANTIBODY (ROUTINE TESTING W REFLEX): HIV Screen 4th Generation wRfx: NONREACTIVE

## 2015-10-29 LAB — SURGICAL PCR SCREEN
MRSA, PCR: NEGATIVE
STAPHYLOCOCCUS AUREUS: POSITIVE — AB

## 2015-10-29 LAB — GLUCOSE, CAPILLARY: Glucose-Capillary: 96 mg/dL (ref 65–99)

## 2015-10-29 SURGERY — IRRIGATION AND DEBRIDEMENT EXTREMITY
Anesthesia: General | Site: Arm Upper | Laterality: Right

## 2015-10-29 MED ORDER — OXYCODONE HCL 5 MG PO TABS: 5.0000 mg | ORAL_TABLET | Freq: Once | ORAL | Status: DC | PRN

## 2015-10-29 MED ORDER — ONDANSETRON HCL 4 MG/2ML IJ SOLN
INTRAMUSCULAR | Status: AC
  Filled 2015-10-29: qty 2

## 2015-10-29 MED ORDER — SUCCINYLCHOLINE 20MG/ML (10ML) SYRINGE FOR MEDFUSION PUMP - OPTIME
INTRAMUSCULAR | Status: DC | PRN
  Administered 2015-10-29: 120 mg via INTRAVENOUS

## 2015-10-29 MED ORDER — DIPHENHYDRAMINE HCL 50 MG/ML IJ SOLN
INTRAMUSCULAR | Status: AC
  Filled 2015-10-29: qty 1

## 2015-10-29 MED ORDER — 0.9 % SODIUM CHLORIDE (POUR BTL) OPTIME
TOPICAL | Status: DC | PRN
  Administered 2015-10-29: 1000 mL

## 2015-10-29 MED ORDER — VANCOMYCIN HCL IN DEXTROSE 1-5 GM/200ML-% IV SOLN
1000.0000 mg | Freq: Three times a day (TID) | INTRAVENOUS | Status: DC
  Administered 2015-10-29 – 2015-10-30 (×3): 1000 mg via INTRAVENOUS
  Filled 2015-10-29 (×5): qty 200

## 2015-10-29 MED ORDER — DIPHENHYDRAMINE HCL 50 MG/ML IJ SOLN
INTRAMUSCULAR | Status: DC | PRN
  Administered 2015-10-29: 25 mg via INTRAVENOUS

## 2015-10-29 MED ORDER — FENTANYL CITRATE (PF) 100 MCG/2ML IJ SOLN
INTRAMUSCULAR | Status: AC
  Filled 2015-10-29: qty 2

## 2015-10-29 MED ORDER — PROPOFOL 10 MG/ML IV BOLUS
INTRAVENOUS | Status: AC
  Filled 2015-10-29: qty 20

## 2015-10-29 MED ORDER — ACETAMINOPHEN 500 MG PO TABS
1000.0000 mg | ORAL_TABLET | Freq: Once | ORAL | Status: AC
  Administered 2015-10-29: 1000 mg via ORAL
  Filled 2015-10-29: qty 2

## 2015-10-29 MED ORDER — FENTANYL CITRATE (PF) 100 MCG/2ML IJ SOLN
100.0000 ug | Freq: Once | INTRAMUSCULAR | Status: DC
  Filled 2015-10-29: qty 2

## 2015-10-29 MED ORDER — MIDAZOLAM HCL 2 MG/2ML IJ SOLN
INTRAMUSCULAR | Status: AC
  Filled 2015-10-29: qty 2

## 2015-10-29 MED ORDER — ONDANSETRON HCL 4 MG/2ML IJ SOLN: 4.0000 mg | Freq: Once | INTRAMUSCULAR | Status: DC | PRN

## 2015-10-29 MED ORDER — FENTANYL CITRATE (PF) 100 MCG/2ML IJ SOLN
INTRAMUSCULAR | Status: DC | PRN
  Administered 2015-10-29 (×2): 25 ug via INTRAVENOUS
  Administered 2015-10-29 (×3): 50 ug via INTRAVENOUS

## 2015-10-29 MED ORDER — METHOCARBAMOL 500 MG PO TABS
500.0000 mg | ORAL_TABLET | Freq: Three times a day (TID) | ORAL | Status: DC
  Administered 2015-10-29 – 2015-10-31 (×6): 500 mg via ORAL
  Filled 2015-10-29 (×7): qty 1

## 2015-10-29 MED ORDER — MIDAZOLAM HCL 5 MG/5ML IJ SOLN
INTRAMUSCULAR | Status: DC | PRN
  Administered 2015-10-29: 2 mg via INTRAVENOUS

## 2015-10-29 MED ORDER — FENTANYL CITRATE (PF) 100 MCG/2ML IJ SOLN
25.0000 ug | INTRAMUSCULAR | Status: DC | PRN
  Administered 2015-10-29 (×2): 50 ug via INTRAVENOUS

## 2015-10-29 MED ORDER — LIDOCAINE HCL (CARDIAC) 20 MG/ML IV SOLN
INTRAVENOUS | Status: DC | PRN
  Administered 2015-10-29: 40 mg via INTRAVENOUS

## 2015-10-29 MED ORDER — HYDROMORPHONE HCL 1 MG/ML IJ SOLN
0.2500 mg | INTRAMUSCULAR | Status: DC | PRN
  Administered 2015-10-29 (×2): 0.5 mg via INTRAVENOUS

## 2015-10-29 MED ORDER — FENTANYL CITRATE (PF) 100 MCG/2ML IJ SOLN
50.0000 ug | INTRAMUSCULAR | Status: DC | PRN
  Administered 2015-10-29: 50 ug via INTRAVENOUS

## 2015-10-29 MED ORDER — LACTATED RINGERS IV SOLN: INTRAVENOUS | Status: DC

## 2015-10-29 MED ORDER — HYDROMORPHONE HCL 1 MG/ML IJ SOLN
INTRAMUSCULAR | Status: AC
  Filled 2015-10-29: qty 1

## 2015-10-29 MED ORDER — ENOXAPARIN SODIUM 40 MG/0.4ML ~~LOC~~ SOLN
40.0000 mg | SUBCUTANEOUS | Status: DC
  Administered 2015-10-30 – 2015-10-31 (×2): 40 mg via SUBCUTANEOUS
  Filled 2015-10-29 (×2): qty 0.4

## 2015-10-29 MED ORDER — LACTATED RINGERS IV SOLN
INTRAVENOUS | Status: DC | PRN
  Administered 2015-10-29: 14:00:00 via INTRAVENOUS

## 2015-10-29 MED ORDER — PROPOFOL 10 MG/ML IV BOLUS
INTRAVENOUS | Status: DC | PRN
  Administered 2015-10-29: 20 mg via INTRAVENOUS
  Administered 2015-10-29: 200 mg via INTRAVENOUS
  Administered 2015-10-29: 20 mg via INTRAVENOUS

## 2015-10-29 MED ORDER — VANCOMYCIN HCL IN DEXTROSE 1-5 GM/200ML-% IV SOLN: 1000.0000 mg | INTRAVENOUS | Status: DC

## 2015-10-29 MED ORDER — SODIUM CHLORIDE 0.9 % IR SOLN
Status: DC | PRN
  Administered 2015-10-29: 3000 mL

## 2015-10-29 MED ORDER — OXYCODONE HCL 5 MG/5ML PO SOLN
5.0000 mg | Freq: Once | ORAL | Status: DC | PRN
Start: 2015-10-29 — End: 2015-10-29

## 2015-10-29 MED ORDER — CHLORHEXIDINE GLUCONATE 4 % EX LIQD
60.0000 mL | Freq: Once | CUTANEOUS | Status: AC
  Administered 2015-10-29: 4 via TOPICAL
  Filled 2015-10-29: qty 60

## 2015-10-29 SURGICAL SUPPLY — 45 items
BANDAGE ACE 4X5 VEL STRL LF (GAUZE/BANDAGES/DRESSINGS) ×3 IMPLANT
BANDAGE ACE 6X5 VEL STRL LF (GAUZE/BANDAGES/DRESSINGS) ×3 IMPLANT
BLADE SURG 10 STRL SS (BLADE) ×3 IMPLANT
BNDG COHESIVE 4X5 TAN STRL (GAUZE/BANDAGES/DRESSINGS) ×3 IMPLANT
BNDG GAUZE ELAST 4 BULKY (GAUZE/BANDAGES/DRESSINGS) ×4 IMPLANT
CONT SPEC 4OZ CLIKSEAL STRL BL (MISCELLANEOUS) ×2 IMPLANT
COVER SURGICAL LIGHT HANDLE (MISCELLANEOUS) ×3 IMPLANT
CUFF TOURNIQUET SINGLE 24IN (TOURNIQUET CUFF) ×2 IMPLANT
CUFF TOURNIQUET SINGLE 34IN LL (TOURNIQUET CUFF) IMPLANT
DRAIN PENROSE 1/4X12 LTX STRL (WOUND CARE) ×2 IMPLANT
DRSG PAD ABDOMINAL 8X10 ST (GAUZE/BANDAGES/DRESSINGS) ×4 IMPLANT
DURAPREP 26ML APPLICATOR (WOUND CARE) ×3 IMPLANT
ELECT REM PT RETURN 9FT ADLT (ELECTROSURGICAL)
ELECTRODE REM PT RTRN 9FT ADLT (ELECTROSURGICAL) IMPLANT
EVACUATOR 1/8 PVC DRAIN (DRAIN) IMPLANT
FACESHIELD WRAPAROUND (MASK) ×9 IMPLANT
FACESHIELD WRAPAROUND OR TEAM (MASK) ×3 IMPLANT
GAUZE IODOFORM PACK 1/2 7832 (GAUZE/BANDAGES/DRESSINGS) ×2 IMPLANT
GAUZE SPONGE 4X4 12PLY STRL (GAUZE/BANDAGES/DRESSINGS) ×3 IMPLANT
GAUZE XEROFORM 1X8 LF (GAUZE/BANDAGES/DRESSINGS) ×3 IMPLANT
GLOVE BIOGEL PI IND STRL 8 (GLOVE) ×2 IMPLANT
GLOVE BIOGEL PI INDICATOR 8 (GLOVE) ×4
GOWN STRL REUS W/ TWL LRG LVL3 (GOWN DISPOSABLE) ×3 IMPLANT
GOWN STRL REUS W/TWL LRG LVL3 (GOWN DISPOSABLE) ×9
HANDPIECE INTERPULSE COAX TIP (DISPOSABLE)
KIT BASIN OR (CUSTOM PROCEDURE TRAY) ×3 IMPLANT
KIT ROOM TURNOVER OR (KITS) ×3 IMPLANT
MANIFOLD NEPTUNE II (INSTRUMENTS) ×3 IMPLANT
NS IRRIG 1000ML POUR BTL (IV SOLUTION) ×3 IMPLANT
PACK ORTHO EXTREMITY (CUSTOM PROCEDURE TRAY) ×3 IMPLANT
PAD ARMBOARD 7.5X6 YLW CONV (MISCELLANEOUS) ×6 IMPLANT
PENCIL BUTTON HOLSTER BLD 10FT (ELECTRODE) IMPLANT
SET HNDPC FAN SPRY TIP SCT (DISPOSABLE) IMPLANT
SPONGE LAP 18X18 X RAY DECT (DISPOSABLE) ×3 IMPLANT
STOCKINETTE IMPERVIOUS 9X36 MD (GAUZE/BANDAGES/DRESSINGS) ×3 IMPLANT
SUT ETHILON 3 0 PS 1 (SUTURE) ×4 IMPLANT
TOWEL OR 17X24 6PK STRL BLUE (TOWEL DISPOSABLE) ×3 IMPLANT
TOWEL OR 17X26 10 PK STRL BLUE (TOWEL DISPOSABLE) ×3 IMPLANT
TOWEL OR NON WOVEN STRL DISP B (DISPOSABLE) ×3 IMPLANT
TUBE ANAEROBIC SPECIMEN COL (MISCELLANEOUS) IMPLANT
TUBE CONNECTING 12'X1/4 (SUCTIONS) ×1
TUBE CONNECTING 12X1/4 (SUCTIONS) ×2 IMPLANT
UNDERPAD 30X30 (UNDERPADS AND DIAPERS) ×3 IMPLANT
WATER STERILE IRR 1000ML POUR (IV SOLUTION) ×3 IMPLANT
YANKAUER SUCT BULB TIP NO VENT (SUCTIONS) ×3 IMPLANT

## 2015-10-29 NOTE — Anesthesia Postprocedure Evaluation (Signed)
Anesthesia Post Note  Patient: Paul Shah  Procedure(s) Performed: Procedure(s) (LRB): IRRIGATION AND DEBRIDEMENT RIGHT UPPER EXTREMITY (Right)  Patient location during evaluation: PACU Anesthesia Type: General Level of consciousness: awake and alert Pain management: pain level controlled Vital Signs Assessment: post-procedure vital signs reviewed and stable Respiratory status: spontaneous breathing, nonlabored ventilation, respiratory function stable and patient connected to nasal cannula oxygen Cardiovascular status: blood pressure returned to baseline and stable Postop Assessment: no signs of nausea or vomiting Anesthetic complications: no    Last Vitals:  Vitals:   10/29/15 0559 10/29/15 1521  BP: (!) 116/52 (!) 143/91  Pulse: 82 90  Resp: 18 18  Temp: 36.7 C     Last Pain:  Vitals:   10/29/15 1521  TempSrc:   PainSc: 0-No pain                 Reino KentJudd, Eulene Pekar J

## 2015-10-29 NOTE — Consult Note (Signed)
ORTHOPAEDIC CONSULTATION  REQUESTING PHYSICIAN: Lorretta HarpXilin Niu, MD  Chief Complaint: Right arm pain  Assessment: Principal Problem:   Right arm cellulitis Active Problems:   TOBACCO ABUSE   Abscess of arm   Sepsis (HCC)   Anxiety  Right arm abscess needing surgical I/D s/p superficial I/D in the ED.  Plan: NPO Incision and Drainage by Dr. Wandra Feinstein. Murphy today. Continue antibiotic therapy Weight Bearing Status: WBAT right arm. VTE px: SCD's and ambulation  HPI: Paul Shah is a 25 y.o. male who complains of right arm pain since late Friday into Saturday.  It is progressively worse. He noted previous fever and chills, but these have resolved today. Pain is severe requiring IV medicines. He denies lesions in the area prior to I&D in the emergency department. History of IV drug use the day prior to symptom onset.  He has been nothing by mouth since midnight.  Orthopedics was consulted for evaluation.  The risks benefits and alternatives of the procedure were discussed with the patient including but not limited to infection, bleeding, nerve injury, the need for revision surgery, blood clots, cardiopulmonary complications, morbidity, mortality, among others.  The patient verbalizes understanding and wishes to proceed.     Past Medical History:  Diagnosis Date  . ADD (attention deficit disorder)   . Anxiety   . Depression   . IV drug abuse   . Kidney stone   . Renal disorder    Past Surgical History:  Procedure Laterality Date  . DENTAL SURGERY     Social History   Social History  . Marital status: Single    Spouse name: N/A  . Number of children: N/A  . Years of education: N/A   Social History Main Topics  . Smoking status: Current Every Day Smoker    Types: Cigarettes  . Smokeless tobacco: Never Used  . Alcohol use No  . Drug use: Unknown    Types: IV     Comment: hx of IV  . Sexual activity: Not Asked   Other Topics Concern  . None   Social History  Narrative  . None   Family History  Problem Relation Age of Onset  . Heart attack Paternal Grandfather    Allergies  Allergen Reactions  . Flexeril [Cyclobenzaprine] Hives and Shortness Of Breath  . Cefaclor     REACTION: rash  . Gadolinium Derivatives Itching    Itchy ears throat and legs   . Latex     REACTION: rash  . Topiramate Itching and Swelling    Swelling of nose and face. Numbness in legs and toes.    Prior to Admission medications   Medication Sig Start Date End Date Taking? Authorizing Provider  ibuprofen (ADVIL,MOTRIN) 200 MG tablet Take 200 mg by mouth every 6 (six) hours as needed for moderate pain.   Yes Historical Provider, MD   Dg Chest 1 View  Result Date: 10/28/2015 CLINICAL DATA:  Acute onset of right arm infection. Initial encounter. EXAM: CHEST 1 VIEW COMPARISON:  Chest radiograph performed 10/28/2009 FINDINGS: The lungs are well-aerated and clear. There is no evidence of focal opacification, pleural effusion or pneumothorax. The cardiomediastinal silhouette is within normal limits. No acute osseous abnormalities are seen. IMPRESSION: No acute cardiopulmonary process seen. Electronically Signed   By: Roanna RaiderJeffery  Chang M.D.   On: 10/28/2015 23:47    Positive ROS: All other systems have been reviewed and were otherwise negative with the exception of those mentioned in the HPI and  as above.  Objective: Labs cbc  Recent Labs  10/28/15 1405  WBC 17.6*  HGB 13.3  HCT 39.0  PLT 244    Labs inflam  Recent Labs  10/28/15 2018  CRP 17.3*    Labs coag  Recent Labs  10/28/15 2018  INR 1.22     Recent Labs  10/28/15 1405  NA 136  K 3.8  CL 103  CO2 24  GLUCOSE 95  BUN 15  CREATININE 0.97  CALCIUM 9.3    Physical Exam: Vitals:   10/28/15 1939 10/29/15 0559  BP: 131/75 (!) 116/52  Pulse: 96 82  Resp: 18 18  Temp: 100 F (37.8 C) 98.1 F (36.7 C)   General: Alert, no acute distress.  Supine in bed Mental status: Alert and  Oriented x3 Neurologic: Speech Clear and organized, no gross focal findings or movement disorder appreciated. Respiratory: No cyanosis, no use of accessory musculature Cardiovascular: No pedal edema GI: Abdomen is soft and non-tender, non-distended. Skin: Warm and dry.   Extremities: Warm and well perfused w/o edema Psychiatric: Patient is competent for consent with normal mood and affect  MUSCULOSKELETAL:  Right arm swollen at [facility] fossa with approx 1 cm horizontal superficial I/D incision.  Erythema and swelling extends proximally and distally moderately limiting ROM.  The area is warm and tender to palpation.  There is no purulence on gauze dressing. Right hand warm with good grip strength.  NVI.  Sensation intact distally.  Other extremities are atraumatic with painless ROM and NVI.   Albina Billet III PA-C 10/29/2015 6:55 AM

## 2015-10-29 NOTE — Care Management Note (Signed)
Case Management Note  Patient Details  Name: Paul BoerDaniel Genrich MRN: [id number] Date of Birth: 04-01-1990  Subjective/Objective:        Admitted with R arm cellulitis, hx of IVDA. Resides with fiance, Crystal. Independent with ADL's and no DME usage PTA.           [name] (Sgo)     [phone]       PCP:  Pt without PCP. CM provided pt with [facility] information to help establish PCP.  Action/Plan: Plan : IRRIGATION AND DEBRIDEMENT EXTREMITY of RUE today,10/29/2015. CM to f/u with disposition needs.   Expected Discharge Date:                  Expected Discharge Plan:  Home/Self Care  In-House Referral:     Discharge planning Services  CM Consult  Post Acute Care Choice:    Choice offered to:     DME Arranged:    DME Agency:     HH Arranged:    HH Agency:     Status of Service:  In process, will continue to follow  If discussed at Long Length of Stay Meetings, dates discussed:    Additional Comments:  Epifanio LeschesCole, Andersson Larrabee Hudson, RN 10/29/2015, 10:41 AM

## 2015-10-29 NOTE — Transfer of Care (Signed)
Immediate Anesthesia Transfer of Care Note  Patient: Rozanna BoerDaniel Morneault  Procedure(s) Performed: Procedure(s): IRRIGATION AND DEBRIDEMENT RIGHT UPPER EXTREMITY (Right)  Patient Location: PACU  Anesthesia Type:General  Level of Consciousness: sedated  Airway & Oxygen Therapy: Patient Spontanous Breathing and Patient connected to nasal cannula oxygen  Post-op Assessment: Report given to RN and Post -op Vital signs reviewed and stable  Post vital signs: Reviewed and stable  Last Vitals:  Vitals:   10/29/15 0559 10/29/15 1521  BP: (!) 116/52 (!) 143/91  Pulse: 82 90  Resp: 18 18  Temp: 36.7 C     Last Pain:  Vitals:   10/29/15 1521  TempSrc:   PainSc: 0-No pain      Patients Stated Pain Goal: 3 (10/29/15 0910)  Complications: No apparent anesthesia complications

## 2015-10-29 NOTE — H&P (View-Only) (Signed)
ORTHOPAEDIC CONSULTATION  REQUESTING PHYSICIAN: Lorretta HarpXilin Niu, MD  Chief Complaint: Right arm pain  Assessment: Principal Problem:   Right arm cellulitis Active Problems:   TOBACCO ABUSE   Abscess of arm   Sepsis (HCC)   Anxiety  Right arm abscess needing surgical I/D s/p superficial I/D in the ED.  Plan: NPO Incision and Drainage by Dr. Wandra Feinstein. Murphy today. Continue antibiotic therapy Weight Bearing Status: WBAT right arm. VTE px: SCD's and ambulation  HPI: Paul BoerDaniel Fiallo is a 25 y.o. male who complains of right arm pain since late Friday into Saturday.  It is progressively worse. He noted previous fever and chills, but these have resolved today. Pain is severe requiring IV medicines. He denies lesions in the area prior to I&D in the emergency department. History of IV drug use the day prior to symptom onset.  He has been nothing by mouth since midnight.  Orthopedics was consulted for evaluation.  The risks benefits and alternatives of the procedure were discussed with the patient including but not limited to infection, bleeding, nerve injury, the need for revision surgery, blood clots, cardiopulmonary complications, morbidity, mortality, among others.  The patient verbalizes understanding and wishes to proceed.     Past Medical History:  Diagnosis Date  . ADD (attention deficit disorder)   . Anxiety   . Depression   . IV drug abuse   . Kidney stone   . Renal disorder    Past Surgical History:  Procedure Laterality Date  . DENTAL SURGERY     Social History   Social History  . Marital status: Single    Spouse name: N/A  . Number of children: N/A  . Years of education: N/A   Social History Main Topics  . Smoking status: Current Every Day Smoker    Types: Cigarettes  . Smokeless tobacco: Never Used  . Alcohol use No  . Drug use: Unknown    Types: IV     Comment: hx of IV  . Sexual activity: Not Asked   Other Topics Concern  . None   Social History  Narrative  . None   Family History  Problem Relation Age of Onset  . Heart attack Paternal Grandfather    Allergies  Allergen Reactions  . Flexeril [Cyclobenzaprine] Hives and Shortness Of Breath  . Cefaclor     REACTION: rash  . Gadolinium Derivatives Itching    Itchy ears throat and legs   . Latex     REACTION: rash  . Topiramate Itching and Swelling    Swelling of nose and face. Numbness in legs and toes.    Prior to Admission medications   Medication Sig Start Date End Date Taking? Authorizing Provider  ibuprofen (ADVIL,MOTRIN) 200 MG tablet Take 200 mg by mouth every 6 (six) hours as needed for moderate pain.   Yes Historical Provider, MD   Dg Chest 1 View  Result Date: 10/28/2015 CLINICAL DATA:  Acute onset of right arm infection. Initial encounter. EXAM: CHEST 1 VIEW COMPARISON:  Chest radiograph performed 10/28/2009 FINDINGS: The lungs are well-aerated and clear. There is no evidence of focal opacification, pleural effusion or pneumothorax. The cardiomediastinal silhouette is within normal limits. No acute osseous abnormalities are seen. IMPRESSION: No acute cardiopulmonary process seen. Electronically Signed   By: Roanna RaiderJeffery  Chang M.D.   On: 10/28/2015 23:47    Positive ROS: All other systems have been reviewed and were otherwise negative with the exception of those mentioned in the HPI and  as above.  Objective: Labs cbc  Recent Labs  10/28/15 1405  WBC 17.6*  HGB 13.3  HCT 39.0  PLT 244    Labs inflam  Recent Labs  10/28/15 2018  CRP 17.3*    Labs coag  Recent Labs  10/28/15 2018  INR 1.22     Recent Labs  10/28/15 1405  NA 136  K 3.8  CL 103  CO2 24  GLUCOSE 95  BUN 15  CREATININE 0.97  CALCIUM 9.3    Physical Exam: Vitals:   10/28/15 1939 10/29/15 0559  BP: 131/75 (!) 116/52  Pulse: 96 82  Resp: 18 18  Temp: 100 F (37.8 C) 98.1 F (36.7 C)   General: Alert, no acute distress.  Supine in bed Mental status: Alert and  Oriented x3 Neurologic: Speech Clear and organized, no gross focal findings or movement disorder appreciated. Respiratory: No cyanosis, no use of accessory musculature Cardiovascular: No pedal edema GI: Abdomen is soft and non-tender, non-distended. Skin: Warm and dry.   Extremities: Warm and well perfused w/o edema Psychiatric: Patient is competent for consent with normal mood and affect  MUSCULOSKELETAL:  Right arm swollen at [facility] fossa with approx 1 cm horizontal superficial I/D incision.  Erythema and swelling extends proximally and distally moderately limiting ROM.  The area is warm and tender to palpation.  There is no purulence on gauze dressing. Right hand warm with good grip strength.  NVI.  Sensation intact distally.  Other extremities are atraumatic with painless ROM and NVI.   Albina Billet III PA-C 10/29/2015 6:55 AM

## 2015-10-29 NOTE — Anesthesia Preprocedure Evaluation (Addendum)
Anesthesia Evaluation  Patient identified by MRN, date of birth, ID band Patient awake    Reviewed: Allergy & Precautions, NPO status , Patient's Chart, lab work & pertinent test results  Airway Mallampati: II  TM Distance: >3 FB Neck ROM: Full    Dental  (+) Teeth Intact, Dental Advisory Given   Pulmonary Current Smoker,    breath sounds clear to auscultation       Cardiovascular  Rhythm:Regular Rate:Normal     Neuro/Psych    GI/Hepatic   Endo/Other    Renal/GU      Musculoskeletal   Abdominal   Peds  Hematology   Anesthesia Other Findings   Reproductive/Obstetrics                            Anesthesia Physical Anesthesia Plan  ASA: III  Anesthesia Plan: General   Post-op Pain Management:    Induction: Intravenous  Airway Management Planned: Oral ETT  Additional Equipment:   Intra-op Plan:   Post-operative Plan: Extubation in OR  Informed Consent: I have reviewed the patients History and Physical, chart, labs and discussed the procedure including the risks, benefits and alternatives for the proposed anesthesia with the patient or authorized representative who has indicated his/her understanding and acceptance.   Dental advisory given  Plan Discussed with: CRNA and Anesthesiologist  Anesthesia Plan Comments:         Anesthesia Quick Evaluation  

## 2015-10-29 NOTE — Interval H&P Note (Signed)
History and Physical Interval Note:  10/29/2015 10:46 AM  Paul Shah  has presented today for surgery, with the diagnosis of RIGHT ARM ABSCESS  The various methods of treatment have been discussed with the patient and family. After consideration of risks, benefits and other options for treatment, the patient has consented to  Procedure(s): IRRIGATION AND DEBRIDEMENT EXTREMITY (Right) as a surgical intervention .  The patient's history has been reviewed, patient examined, no change in status, stable for surgery.  I have reviewed the patient's chart and labs.  Questions were answered to the patient's satisfaction.     Geron Mulford D

## 2015-10-29 NOTE — Anesthesia Procedure Notes (Signed)
Procedure Name: Intubation Date/Time: 10/29/2015 2:15 PM Performed by: Dairl PonderJIANG, Kazue Cerro Pre-anesthesia Checklist: Patient identified, Emergency Drugs available, Suction available, Patient being monitored and Timeout performed Patient Re-evaluated:Patient Re-evaluated prior to inductionOxygen Delivery Method: Circle system utilized Preoxygenation: Pre-oxygenation with 100% oxygen Intubation Type: IV induction Ventilation: Mask ventilation without difficulty Laryngoscope Size: Mac and 4 Grade View: Grade I Tube type: Oral Tube size: 7.5 mm Number of attempts: 1 Airway Equipment and Method: Stylet Placement Confirmation: ETT inserted through vocal cords under direct vision,  positive ETCO2 and breath sounds checked- equal and bilateral Secured at: 23 cm Tube secured with: Tape Dental Injury: Teeth and Oropharynx as per pre-operative assessment

## 2015-10-29 NOTE — Op Note (Signed)
10/28/2015 - 10/29/2015  2:09 PM  PATIENT:  Paul Shah    PRE-OPERATIVE DIAGNOSIS:  RIGHT ARM ABSCESS  POST-OPERATIVE DIAGNOSIS:  Same  PROCEDURE:  IRRIGATION AND DEBRIDEMENT EXTREMITY  SURGEON:  Henriette Hesser, Jewel BaizeIMOTHY D, MD  ASSISTANT: Aquilla HackerHenry Martensen, PA-C, he was present and scrubbed throughout the case, critical for completion in a timely fashion, and for retraction, instrumentation, and closure.   ANESTHESIA:   gen  PREOPERATIVE INDICATIONS:  Paul Shah is a  25 y.o. male with a diagnosis of RIGHT ARM ABSCESS who failed conservative measures and elected for surgical management.    The risks benefits and alternatives were discussed with the patient preoperatively including but not limited to the risks of infection, bleeding, nerve injury, cardiopulmonary complications, the need for revision surgery, among others, and the patient was willing to proceed.  OPERATIVE IMPLANTS: none  OPERATIVE FINDINGS: purulent collection  BLOOD LOSS: min  COMPLICATIONS: none  TOURNIQUET TIME: 30min  OPERATIVE PROCEDURE:  Patient was identified in the preoperative holding area and site was marked by me He was transported to the operating theater and placed on the table in supine position taking care to pad all bony prominences. After a preincinduction time out anesthesia was induced. The right upper extremity was prepped and draped in normal sterile fashion and a pre-incision timeout was performed. He received vanc/zosyn after culture for preoperative antibiotics.   First the clinic today culture from the actively draining purulent fluid.  The right upper extremity was prepped and draped.  I extended his bedside incision from the emergency room into a volar Sherilyn CooterHenry approach I extended this down his forearm all told he had roughly 10 cm of wound here. I performed a thorough irrigation totaling 3 L of saline.  Prior to that performed an irrigation and debridement of this deep abscess less than 20  cm to require debridement of some devitalized tissue using scissors curet and a knife. I protected his neurovascular structures.  His brachialis tendon was identified Tenolysis was performed. This tendon was freed up cleared of all purulent fluid.  After this deep incision of his abscess followed by surgical debridement which was excisional using scissors and curet. Completed thorough irrigation with 3 L of saline.  I then performed a complex closure of 10 cm of wound.  Sterile dressing was applied he was awoken and taken the PACU in stable condition  POST OPERATIVE PLAN: continue IV abx, Mobilize for Dvt px.

## 2015-10-29 NOTE — Progress Notes (Signed)
[facility] Progress Note  Patient: Paul BoerDaniel Erway ZOX:[id number]   PCP: No primary care provider on file. DOB: 1990-11-14   DOA: 10/28/2015   DOS: 10/29/2015   Date of Service: the patient was seen and examined on 10/29/2015  Brief hospital course: Pt. with PMH of IVDA; admitted on 10/28/2015, with complaint of right arm pain, was found to have right elbow abcess with polymyositis. Currently further plan is continue IV abx..  Assessment and Plan: 1. Right arm cellulitis From IVDA, Monitor blood cultures. S/p debridement Continue broad spectrum coverage until culture are finalize. As his infection ma be polymicrobial.  2.pain management  ot outright refuses to use oral percocet as he feels it is not doing any relief. He mentions he prefers dilaudid and morphine IV. I explained rationale of pain management and different formulation mode of action. He want to maintain currne tpain regimen. Pt will be post op today, therefor will maintain current regimen and transition to oral once stable to take PO.  Activity: no physical therapy Bowel regimen: last BM prior to admission Diet: regular diet DVT Prophylaxis: subcutaneous Heparin Advance goals of care discussion: full code  Family Communication: family was present at bedside, at the time of interview. The pt provided permission to discuss medical plan with the family. Opportunity was given to ask question and all questions were answered satisfactorily.   Disposition:  Discharge to home. Expected discharge date: 11/01/2015, culture finalization  Consultants: orthopedics Procedures: s/p debridement  Antibiotics: Anti-infectives    Start     Dose/Rate Route Frequency Ordered Stop   10/29/15 2100  vancomycin (VANCOCIN) IVPB 1000 mg/200 mL premix     1,000 mg 200 mL/hr over 60 Minutes Intravenous Every 8 hours 10/29/15 0845     10/29/15 1230  vancomycin (VANCOCIN) IVPB 1000 mg/200 mL premix  Status:  Discontinued     1,000  mg 200 mL/hr over 60 Minutes Intravenous To ShortStay Surgical 10/29/15 0840 10/29/15 1658   10/29/15 0500  vancomycin (VANCOCIN) IVPB 1000 mg/200 mL premix  Status:  Discontinued     1,000 mg 200 mL/hr over 60 Minutes Intravenous Every 8 hours 10/28/15 2032 10/29/15 0845   10/28/15 2200  piperacillin-tazobactam (ZOSYN) IVPB 3.375 g  Status:  Discontinued     3.375 g 100 mL/hr over 30 Minutes Intravenous Every 8 hours 10/28/15 2012 10/28/15 2025   10/28/15 2200  piperacillin-tazobactam (ZOSYN) IVPB 3.375 g     3.375 g 12.5 mL/hr over 240 Minutes Intravenous Every 8 hours 10/28/15 2025     10/28/15 2045  vancomycin (VANCOCIN) 500 mg in sodium chloride 0.9 % 100 mL IVPB     500 mg 100 mL/hr over 60 Minutes Intravenous  Once 10/28/15 2032 10/29/15 0106   10/28/15 1630  piperacillin-tazobactam (ZOSYN) IVPB 3.375 g     3.375 g 100 mL/hr over 30 Minutes Intravenous  Once 10/28/15 1617 10/28/15 1710   10/28/15 1430  vancomycin (VANCOCIN) IVPB 1000 mg/200 mL premix     1,000 mg 200 mL/hr over 60 Minutes Intravenous  Once 10/28/15 1425 10/28/15 1638        Subjective: continue to have pain in right elbow, no acute distress  Objective: Physical Exam: Vitals:   10/29/15 1630 10/29/15 1645 10/29/15 1730 10/29/15 1848  BP: 120/74 113/72 128/71 131/69  Pulse: 81  83 75  Resp: 16  18 19   Temp:  98.6 F (37 C) 99.6 F (37.6 C) 97.6 F (36.4 C)  TempSrc:      SpO2: 100%  100% 100%  Weight:      Height:        Intake/Output Summary (Last 24 hours) at 10/29/15 2004 Last data filed at 10/29/15 1804  Gross per 24 hour  Intake             2610 ml  Output              115 ml  Net             2495 ml   Filed Weights   10/28/15 1234 10/28/15 1939  Weight: 95.3 kg (210 lb) 94.8 kg (209 lb 1.6 oz)    General: Alert, Awake and Oriented to Time, Place and Person. Appear in mild distress, affect appropriate Eyes: PERRL, Conjunctiva normal ENT: Oral Mucosa clear moist. Neck: no JVD, no  Abnormal Mass Or lumps Cardiovascular: S1 and S2 Present, no Murmur, Respiratory: Bilateral Air entry equal and Decreased, no use of accessory muscle, Clear to Auscultation, no Crackles, no wheezes Abdomen: Bowel Sound present, Soft and no tenderness Skin: no redness, no Rash, no induration Extremities: no Pedal edema, no calf tenderness Neurologic: Grossly no focal neuro deficit. Bilaterally Equal motor strength  Data Reviewed: CBC:  Recent Labs Lab 10/28/15 1405 10/29/15 0717  WBC 17.6* 15.2*  NEUTROABS 13.0*  --   HGB 13.3 12.0*  HCT 39.0 36.1*  MCV 88.8 89.4  PLT 244 207   Basic Metabolic Panel:  Recent Labs Lab 10/28/15 1405 10/29/15 0717  NA 136 139  K 3.8 4.6  CL 103 107  CO2 24 24  GLUCOSE 95 88  BUN 15 6  CREATININE 0.97 0.91  CALCIUM 9.3 8.9    Liver Function Tests:  Recent Labs Lab 10/28/15 1405  AST 16  ALT 13*  ALKPHOS 69  BILITOT 1.0  PROT 8.0  ALBUMIN 4.3   No results for input(s): LIPASE, AMYLASE in the last 168 hours. No results for input(s): AMMONIA in the last 168 hours. Coagulation Profile:  Recent Labs Lab 10/28/15 2018  INR 1.22   Cardiac Enzymes: No results for input(s): CKTOTAL, CKMB, CKMBINDEX, TROPONINI in the last 168 hours. BNP (last 3 results) No results for input(s): PROBNP in the last 8760 hours.  CBG:  Recent Labs Lab 10/29/15 0813  GLUCAP 96    Studies: Dg Chest 1 View  Result Date: 10/28/2015 CLINICAL DATA:  Acute onset of right arm infection. Initial encounter. EXAM: CHEST 1 VIEW COMPARISON:  Chest radiograph performed 10/28/2009 FINDINGS: The lungs are well-aerated and clear. There is no evidence of focal opacification, pleural effusion or pneumothorax. The cardiomediastinal silhouette is within normal limits. No acute osseous abnormalities are seen. IMPRESSION: No acute cardiopulmonary process seen. Electronically Signed   By: Roanna Raider M.D.   On: 10/28/2015 23:47   Mr Elbow Right W Wo  Contrast  Result Date: 10/29/2015 CLINICAL DATA:  Swelling, pain, redness, tenderness in the elbow. History of IV drug abuse. EXAM: MRI OF THE RIGHT ELBOW WITHOUT AND WITH CONTRAST TECHNIQUE: Multiplanar, multisequence MR imaging of the elbow was performed before and after the administration of intravenous contrast. CONTRAST:  20mL MULTIHANCE GADOBENATE DIMEGLUMINE 529 MG/ML IV SOLN COMPARISON:  None. FINDINGS: TENDONS Common forearm flexor origin: Intact. Common forearm extensor origin: Intact. Biceps: Intact. Triceps: Intact. LIGAMENTS Medial stabilizers: Intact. Lateral stabilizers:  Intact. Cartilage: No chondral defect. Joint: No joint effusion. Cubital tunnel: Normal cubital tunnel. Bones: No marrow signal abnormality.  No fracture or dislocation. Soft tissue: Circumferential soft tissue edema around the elbow  and proximal forearm most severe along the volar aspect. In the subcutaneous fat at the level of the antecubital fossa there is a multiloculated complex fluid collection with thickened walls measuring 2.7 x 3.1 x 5.7 cm most concerning for abscess. Severe edema in the brachioradialis muscle with enhancement concerning for infectious myositis with a small fluid collection within the muscle consistent with an abscess. IMPRESSION: 1. Cellulitis circumferentially around the elbow and proximal forearm most severe along the volar aspect. Complex multiloculated fluid collection measuring 2.7 x 3.1 x 5.7 cm most consistent with abscess. 2. Severe edema in the brachioradialis muscle with enhancement and a small fluid collection concerning for pyomyositis. Electronically Signed   By: Elige Ko   On: 10/29/2015 09:49     Scheduled Meds: . Melene Muller ON 10/30/2015] enoxaparin (LOVENOX) injection  40 mg Subcutaneous Q24H  . fentaNYL      . fentaNYL (SUBLIMAZE) injection  100 mcg Intravenous Once  . HYDROmorphone      . ibuprofen  200 mg Oral TID  . methocarbamol  500 mg Oral TID  . nicotine  21 mg  Transdermal Daily  . piperacillin-tazobactam (ZOSYN)  IV  3.375 g Intravenous Q8H  . sodium chloride flush  3 mL Intravenous Q12H  . vancomycin  1,000 mg Intravenous Q8H   Continuous Infusions: . sodium chloride 100 mL/hr at 10/29/15 1805  . lactated ringers     PRN Meds: acetaminophen, ALPRAZolam, diphenhydrAMINE, morphine injection, ondansetron, oxyCODONE-acetaminophen, zolpidem  Time spent: 30 minutes  Author: Lynden Oxford, MD [facility] Pager: [phone] 10/29/2015 8:04 PM  If 7PM-7AM, please contact night-coverage at [email or web address], password [facility]

## 2015-10-29 NOTE — Interval H&P Note (Signed)
History and Physical Interval Note:  10/29/2015 7:35 AM  Paul Shah  has presented today for surgery, with the diagnosis of RIGHT ARM ABSCESS  The various methods of treatment have been discussed with the patient and family. After consideration of risks, benefits and other options for treatment, the patient has consented to  Procedure(s): IRRIGATION AND DEBRIDEMENT EXTREMITY (Right) as a surgical intervention .  The patient's history has been reviewed, patient examined, no change in status, stable for surgery.  I have reviewed the patient's chart and labs.  Questions were answered to the patient's satisfaction.     [name]

## 2015-10-29 NOTE — Interval H&P Note (Signed)
History and Physical Interval Note:  10/29/2015 1:51 PM  Paul Shah  has presented today for surgery, with the diagnosis of RIGHT ARM ABSCESS  The various methods of treatment have been discussed with the patient and family. After consideration of risks, benefits and other options for treatment, the patient has consented to  Procedure(s): IRRIGATION AND DEBRIDEMENT EXTREMITY (Right) as a surgical intervention .  The patient's history has been reviewed, patient examined, no change in status, stable for surgery.  I have reviewed the patient's chart and labs.  Questions were answered to the patient's satisfaction.     Laden Fieldhouse D

## 2015-10-30 ENCOUNTER — Inpatient Hospital Stay (HOSPITAL_COMMUNITY): Payer: BLUE CROSS/BLUE SHIELD

## 2015-10-30 ENCOUNTER — Encounter (HOSPITAL_COMMUNITY): Payer: Self-pay | Admitting: Orthopedic Surgery

## 2015-10-30 LAB — BASIC METABOLIC PANEL
Anion gap: 8 (ref 5–15)
CALCIUM: 8.8 mg/dL — AB (ref 8.9–10.3)
CHLORIDE: 104 mmol/L (ref 101–111)
CO2: 25 mmol/L (ref 22–32)
CREATININE: 0.85 mg/dL (ref 0.61–1.24)
GFR calc non Af Amer: >60 mL/min (ref >60)
Glucose, Bld: 105 mg/dL — ABNORMAL HIGH (ref 65–99)
Potassium: 3.8 mmol/L (ref 3.5–5.1)
SODIUM: 137 mmol/L (ref 135–145)

## 2015-10-30 LAB — CBC WITH DIFFERENTIAL/PLATELET
BASOS ABS: 0 10*3/uL (ref 0.0–0.1)
BASOS PCT: 0 %
EOS PCT: 2 %
Eosinophils Absolute: 0.3 10*3/uL (ref 0.0–0.7)
HCT: 35.2 % — ABNORMAL LOW (ref 39.0–52.0)
Hemoglobin: 11.6 g/dL — ABNORMAL LOW (ref 13.0–17.0)
Lymphocytes Relative: 28 %
Lymphs Abs: 3.3 10*3/uL (ref 0.7–4.0)
MCH: 29.4 pg (ref 26.0–34.0)
MCHC: 33 g/dL (ref 30.0–36.0)
MCV: 89.1 fL (ref 78.0–100.0)
MONO ABS: 1 10*3/uL (ref 0.1–1.0)
MONOS PCT: 8 %
Neutro Abs: 7 10*3/uL (ref 1.7–7.7)
Neutrophils Relative %: 62 %
PLATELETS: 223 10*3/uL (ref 150–400)
RBC: 3.95 MIL/uL — ABNORMAL LOW (ref 4.22–5.81)
RDW: 13 % (ref 11.5–15.5)
WBC: 11.6 10*3/uL — ABNORMAL HIGH (ref 4.0–10.5)

## 2015-10-30 LAB — CK: Total CK: 84 U/L (ref 49–397)

## 2015-10-30 LAB — VANCOMYCIN, TROUGH: VANCOMYCIN TR: 10 ug/mL — AB (ref 15–20)

## 2015-10-30 LAB — GLUCOSE, CAPILLARY: Glucose-Capillary: 103 mg/dL — ABNORMAL HIGH (ref 65–99)

## 2015-10-30 MED ORDER — VANCOMYCIN HCL 10 G IV SOLR
1500.0000 mg | Freq: Three times a day (TID) | INTRAVENOUS | Status: DC
  Administered 2015-10-30 – 2015-10-31 (×3): 1500 mg via INTRAVENOUS
  Filled 2015-10-30 (×5): qty 1500

## 2015-10-30 MED ORDER — VANCOMYCIN HCL 10 G IV SOLR
1500.0000 mg | Freq: Three times a day (TID) | INTRAVENOUS | Status: DC
  Filled 2015-10-30: qty 1500

## 2015-10-30 NOTE — Progress Notes (Addendum)
[facility] Progress Note  Patient: Paul Shah ZOX:[id number]   PCP: No primary care provider on file. DOB: 07/06/1990   DOA: 10/28/2015   DOS: 10/30/2015   Date of Service: the patient was seen and examined on 10/30/2015  Brief hospital course: Pt. with PMH of IVDA; admitted on 10/28/2015, with complaint of right arm pain, was found to have right elbow abcess with polymyositis. Currently further plan is continue IV abx..  Assessment and Plan: 1. Right arm abscess/cellulitis -s/p I &D 1/17 -at IVDA site -Blood Cx negative, wound Cx polymicrobial -Ortho following, continue IV Vanc  2. 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   DVT Prophylaxis: subcutaneous Heparin Advance goals of care discussion: full code Family Communication: No family at bedside Disposition: Home in 1-2days  Consultants: orthopedics Procedures: s/p debridement 10/17  Antibiotics: Anti-infectives    Start     Dose/Rate Route Frequency Ordered Stop   10/30/15 2100  vancomycin (VANCOCIN) 1,500 mg in sodium chloride 0.9 % 500 mL IVPB  Status:  Discontinued     1,500 mg 250 mL/hr over 120 Minutes Intravenous Every 8 hours 10/30/15 1359 10/30/15 1359   10/30/15 1400  vancomycin (VANCOCIN) 1,500 mg in sodium chloride 0.9 % 500 mL IVPB     1,500 mg 250 mL/hr over 120 Minutes Intravenous Every 8 hours 10/30/15 1359     10/29/15 2100  vancomycin (VANCOCIN) IVPB 1000 mg/200 mL premix  Status:  Discontinued     1,000 mg 200 mL/hr over 60 Minutes Intravenous Every 8 hours 10/29/15 0845 10/30/15 1359   10/29/15 1230  vancomycin (VANCOCIN) IVPB 1000 mg/200 mL premix  Status:  Discontinued     1,000 mg 200 mL/hr over 60 Minutes Intravenous To ShortStay Surgical 10/29/15 0840 10/29/15 1658   10/29/15 0500  vancomycin (VANCOCIN) IVPB 1000 mg/200 mL premix  Status:  Discontinued     1,000 mg 200 mL/hr over 60 Minutes Intravenous Every 8 hours 10/28/15 2032 10/29/15 0845   10/28/15  2200  piperacillin-tazobactam (ZOSYN) IVPB 3.375 g  Status:  Discontinued     3.375 g 100 mL/hr over 30 Minutes Intravenous Every 8 hours 10/28/15 2012 10/28/15 2025   10/28/15 2200  piperacillin-tazobactam (ZOSYN) IVPB 3.375 g     3.375 g 12.5 mL/hr over 240 Minutes Intravenous Every 8 hours 10/28/15 2025     10/28/15 2045  vancomycin (VANCOCIN) 500 mg in sodium chloride 0.9 % 100 mL IVPB     500 mg 100 mL/hr over 60 Minutes Intravenous  Once 10/28/15 2032 10/29/15 0106   10/28/15 1630  piperacillin-tazobactam (ZOSYN) IVPB 3.375 g     3.375 g 100 mL/hr over 30 Minutes Intravenous  Once 10/28/15 1617 10/28/15 1710   10/28/15 1430  vancomycin (VANCOCIN) IVPB 1000 mg/200 mL premix     1,000 mg 200 mL/hr over 60 Minutes Intravenous  Once 10/28/15 1425 10/28/15 1638        Subjective: doing better, no distress  Objective: Physical Exam: Vitals:   10/29/15 1848 10/29/15 2055 10/30/15 0011 10/30/15 0434  BP: 131/69 125/76 (!) 113/59 129/67  Pulse: 75 95 86 66  Resp: 19 20 18 18   Temp: 97.6 F (36.4 C) (!) 100.5 F (38.1 C) 99.5 F (37.5 C) 98.5 F (36.9 C)  TempSrc:  Oral Oral Oral  SpO2: 100% 100% 99% 100%  Weight:      Height:        Intake/Output Summary (Last 24 hours) at 10/30/15 1419 Last  data filed at 10/30/15 1100  Gross per 24 hour  Intake             1090 ml  Output              490 ml  Net              600 ml   Filed Weights   10/28/15 1234 10/28/15 1939  Weight: 95.3 kg (210 lb) 94.8 kg (209 lb 1.6 oz)    General: Alert, Awake and Oriented to Time, Place and Person. Appear in mild distress, affect appropriate Eyes: PERRL, Conjunctiva normal ENT: Oral Mucosa clear moist. Neck: no JVD, no Abnormal Mass Or lumps Cardiovascular: S1 and S2 Present, no Murmur, Respiratory: Bilateral Air entry equal and Decreased, no use of accessory muscle, Clear to Auscultation, no Crackles, no wheezes Abdomen: Bowel Sound present, Soft and no tenderness Skin: no redness, no  Rash, no induration Extremities: R arm with ACE dressing Neurologic: Grossly no focal neuro deficit. Bilaterally Equal motor strength  Data Reviewed: CBC:  Recent Labs Lab 10/28/15 1405 10/29/15 0717 10/30/15 0602  WBC 17.6* 15.2* 11.6*  NEUTROABS 13.0*  --  7.0  HGB 13.3 12.0* 11.6*  HCT 39.0 36.1* 35.2*  MCV 88.8 89.4 89.1  PLT 244 207 223   Basic Metabolic Panel:  Recent Labs Lab 10/28/15 1405 10/29/15 0717 10/30/15 0602  NA 136 139 137  K 3.8 4.6 3.8  CL 103 107 104  CO2 24 24 25   GLUCOSE 95 88 105*  BUN 15 6 <5*  CREATININE 0.97 0.91 0.85  CALCIUM 9.3 8.9 8.8*    Liver Function Tests:  Recent Labs Lab 10/28/15 1405  AST 16  ALT 13*  ALKPHOS 69  BILITOT 1.0  PROT 8.0  ALBUMIN 4.3   No results for input(s): LIPASE, AMYLASE in the last 168 hours. No results for input(s): AMMONIA in the last 168 hours. Coagulation Profile:  Recent Labs Lab 10/28/15 2018  INR 1.22   Cardiac Enzymes:  Recent Labs Lab 10/30/15 0602  CKTOTAL 84   BNP (last 3 results) No results for input(s): PROBNP in the last 8760 hours.  CBG:  Recent Labs Lab 10/29/15 0813 10/30/15 0719  GLUCAP 96 103*    Studies: No results found.   Scheduled Meds: . enoxaparin (LOVENOX) injection  40 mg Subcutaneous Q24H  . fentaNYL (SUBLIMAZE) injection  100 mcg Intravenous Once  . ibuprofen  200 mg Oral TID  . methocarbamol  500 mg Oral TID  . nicotine  21 mg Transdermal Daily  . piperacillin-tazobactam (ZOSYN)  IV  3.375 g Intravenous Q8H  . sodium chloride flush  3 mL Intravenous Q12H  . vancomycin  1,500 mg Intravenous Q8H   Continuous Infusions: . sodium chloride 50 mL/hr at 10/30/15 1253  . lactated ringers     PRN Meds: acetaminophen, ALPRAZolam, diphenhydrAMINE, morphine injection, ondansetron, oxyCODONE-acetaminophen, zolpidem  Time spent: 30 minutes  Zannie CovePreetha Mendy Lapinsky, MD [facility] Pager: [phone] 10/30/2015 2:19 PM  If 7PM-7AM, please contact  night-coverage at [email or web address], password [email or web address]

## 2015-10-30 NOTE — Progress Notes (Signed)
   Assessment: 1 Day Post-Op  S/P Procedure(s) (LRB): IRRIGATION AND DEBRIDEMENT RIGHT UPPER EXTREMITY (Right) by Dr. Jewel Baizeimothy D. Murphy on 10/29/15  Principal Problem:   Right arm cellulitis Active Problems:   TOBACCO ABUSE   Abscess of arm   Sepsis (HCC)   Anxiety  Plan: Continue surgical dressings - Plan to remove iodoform packing tomorrow. Continue ABX therapy per medicine recommendations.  Cx pending. Keep arm elevated with ice.  Weight Bearing: Weight Bearing as Tolerated (WBAT) right arm. Dressings: Reinforce prn w/ ABD.  VTE prophylaxis: SCDs, ambulation Dispo: Home  Subjective: Patient reports pain as improved from yesterday.  Tolerating diet.  Urinating.  Wanting nasal cannula removed.  No CP, SOB.   Objective:   VITALS:   Vitals:   10/29/15 1848 10/29/15 2055 10/30/15 0011 10/30/15 0434  BP: 131/69 125/76 (!) 113/59 129/67  Pulse: 75 95 86 66  Resp: 19 20 18 18   Temp: 97.6 F (36.4 C) (!) 100.5 F (38.1 C) 99.5 F (37.5 C) 98.5 F (36.9 C)  TempSrc:  Oral Oral Oral  SpO2: 100% 100% 99% 100%  Weight:      Height:       CBC Latest Ref Rng & Units 10/30/2015 10/29/2015 10/28/2015  WBC 4.0 - 10.5 K/uL 11.6(H) 15.2(H) 17.6(H)  Hemoglobin 13.0 - 17.0 g/dL 11.6(L) 12.0(L) 13.3  Hematocrit 39.0 - 52.0 % 35.2(L) 36.1(L) 39.0  Platelets 150 - 400 K/uL 223 207 244   BMP Latest Ref Rng & Units 10/29/2015 10/28/2015 11/16/2013  Glucose 65 - 99 mg/dL 88 95 161(W121(H)  BUN 6 - 20 mg/dL 6 15 19   Creatinine 0.61 - 1.24 mg/dL 9.600.91 4.540.97 0.980.95  Sodium 135 - 145 mmol/L 139 136 141  Potassium 3.5 - 5.1 mmol/L 4.6 3.8 4.4  Chloride 101 - 111 mmol/L 107 103 104  CO2 22 - 32 mmol/L 24 24 25   Calcium 8.9 - 10.3 mg/dL 8.9 9.3 9.7   Intake/Output      10/17 0701 - 10/18 0700 10/18 0701 - 10/19 0700   P.O. 0    I.V. (mL/kg) 1313.3 (13.9)    IV Piggyback 250    Total Intake(mL/kg) 1563.3 (16.5)    Urine (mL/kg/hr)  475 (14)   Blood 15 (0)    Total Output 15 475   Net  +1548.3 -475         Physical Exam: General: NAD.  Supine in bed.  Comfortable appearing. Resp: No increased wob MSK Right arm / hand: Decreased swelling / erythema Neurovascularly intact Sensation intact distally Intact pulses distally Ace wrap in place.  dressing C/D/I  Lucretia KernHenry Calvin Martensen III 10/30/2015, 7:22 AM

## 2015-10-30 NOTE — Progress Notes (Signed)
Pharmacy Antibiotic Note Paul Shah is a 25 y.o. male admitted on 10/28/2015 with right arm cellulitis/abscess s/p debridement in OR on 10/17.  Currently on day 2 of Zosyn and vancomycin.   Vancomycin trough of 10 is slightly below desired range of 15 -20 based on indication.    Plan: 1. Adjust vancomycin to 1500 mg IV every 8 hours starting now 2. Follow culture data and narrow abx as feasible   Height: 6\' 1"  (185.4 cm) Weight: 209 lb 1.6 oz (94.8 kg) IBW/kg (Calculated) : 79.9  Temp (24hrs), Avg:99.1 F (37.3 C), Min:97.6 F (36.4 C), Max:100.5 F (38.1 C)   Recent Labs Lab 10/28/15 1405 10/28/15 1410 10/28/15 2018 10/28/15 2318 10/29/15 0717 10/30/15 0602 10/30/15 1303  WBC 17.6*  --   --   --  15.2* 11.6*  --   CREATININE 0.97  --   --   --  0.91 0.85  --   LATICACIDVEN  --  0.71 1.0 0.8  --   --   --   VANCOTROUGH  --   --   --   --   --   --  10*    Estimated Creatinine Clearance: 148.8 mL/min (by C-G formula based on SCr of 0.85 mg/dL).    Allergies  Allergen Reactions  . Flexeril [Cyclobenzaprine] Hives and Shortness Of Breath  . Cefaclor     REACTION: rash  . Gadolinium Derivatives Itching    Itchy ears throat and legs   . Latex     REACTION: rash  . Topiramate Itching and Swelling    Swelling of nose and face. Numbness in legs and toes.     Antimicrobials this admission: 10/16 Vancomycin >> 10/16 Zosyn >>  Dose adjustments this admission: n/a  Microbiology results: 10/17: OP cx: px 10/16 blood x1>> ngtd 10/16 right arm abscess >> px  Thank you for allowing pharmacy to be a part of this patient's care.  Pollyann SamplesAndy Roselind Klus, PharmD, BCPS 10/30/2015, 2:03 PM Pager: [phone]

## 2015-10-31 LAB — GLUCOSE, CAPILLARY: Glucose-Capillary: 109 mg/dL — ABNORMAL HIGH (ref 65–99)

## 2015-10-31 LAB — AEROBIC CULTURE W GRAM STAIN (SUPERFICIAL SPECIMEN)

## 2015-10-31 LAB — AEROBIC CULTURE  (SUPERFICIAL SPECIMEN)

## 2015-10-31 MED ORDER — AMOXICILLIN-POT CLAVULANATE 875-125 MG PO TABS: 1.0000 | ORAL_TABLET | Freq: Two times a day (BID) | ORAL | 0 refills | Status: DC

## 2015-10-31 MED ORDER — DOXYCYCLINE HYCLATE 50 MG PO CAPS: 100.0000 mg | ORAL_CAPSULE | Freq: Two times a day (BID) | ORAL | 0 refills | Status: DC

## 2015-10-31 MED ORDER — ACETAMINOPHEN 325 MG PO TABS: 650.0000 mg | ORAL_TABLET | Freq: Four times a day (QID) | ORAL | Status: AC | PRN

## 2015-10-31 MED ORDER — TRAMADOL HCL 50 MG PO TABS: 50.0000 mg | ORAL_TABLET | Freq: Four times a day (QID) | ORAL | 0 refills | Status: DC | PRN

## 2015-10-31 NOTE — Progress Notes (Signed)
   Assessment: 2 Days Post-Op  S/P Procedure(s) (LRB): IRRIGATION AND DEBRIDEMENT RIGHT UPPER EXTREMITY (Right) by Dr. Jewel Baizeimothy D. Murphy on 10/29/15  Principal Problem:   Right arm cellulitis Active Problems:   TOBACCO ABUSE   Abscess of arm   Sepsis (HCC)   Anxiety  Plan: Iodoform packing removed today. Continue ABX therapy per medicine recommendations.  Cx pending.  PO preferred at d/c if appropriate dt history of IV drug use. Keep arm flexed at 90 degrees and elevated with ice. Smoking cessation  Weight Bearing: Weight Bearing as Tolerated (WBAT) right arm.   Dressings: Dry gauze / Kerlix / ABD / Ace wrap VTE prophylaxis: Lovenox, SCDs, ambulation.   Dispo: Home per medicine.  Cx pending.  Plan for outpatient follow up with Dr. Wandra Feinstein. Murphy on Monday or Wednesday of next week.  I discussed this with the patient.  Please call with questions.  Subjective: Feeling better again.  Patient reports pain as improving.  Tolerating diet.  Urinating.  No CP, SOB.   Objective:   VITALS:   Vitals:   10/30/15 0434 10/30/15 1420 10/30/15 2110 10/31/15 0524  BP: 129/67 138/66 (!) 146/83 (!) 142/85  Pulse: 66  77 86  Resp: 18 18 18 16   Temp: 98.5 F (36.9 C) 97.9 F (36.6 C) 98 F (36.7 C) 97.7 F (36.5 C)  TempSrc: Oral Oral Oral Oral  SpO2: 100%  100% 100%  Weight:      Height:       CBC Latest Ref Rng & Units 10/30/2015 10/29/2015 10/28/2015  WBC 4.0 - 10.5 K/uL 11.6(H) 15.2(H) 17.6(H)  Hemoglobin 13.0 - 17.0 g/dL 11.6(L) 12.0(L) 13.3  Hematocrit 39.0 - 52.0 % 35.2(L) 36.1(L) 39.0  Platelets 150 - 400 K/uL 223 207 244   BMP Latest Ref Rng & Units 10/30/2015 10/29/2015 10/28/2015  Glucose 65 - 99 mg/dL 161(W105(H) 88 95  BUN 6 - 20 mg/dL <9(U<5(L) 6 15  Creatinine 0.61 - 1.24 mg/dL 0.450.85 4.090.91 8.110.97  Sodium 135 - 145 mmol/L 137 139 136  Potassium 3.5 - 5.1 mmol/L 3.8 4.6 3.8  Chloride 101 - 111 mmol/L 104 107 103  CO2 22 - 32 mmol/L 25 24 24   Calcium 8.9 - 10.3 mg/dL 9.1(Y8.8(L) 8.9  9.3   Intake/Output      10/18 0701 - 10/19 0700   P.O. 560   IV Piggyback 1100   Total Intake(mL/kg) 1660 (17.5)   Urine (mL/kg/hr) 1625 (0.7)   Total Output 1625   Net +35       Urine Occurrence 3 x    Physical Exam: General: NAD.  Supine in bed.  Comfortable appearing. Resp: No increased wob MSK Right arm / hand: Wound with SS drainage on inner dressings.  No purulence.  Packing removed. Neurovascularly intact Sensation intact distally Intact pulses distally Hand warm.  Good grip strength   Lucretia KernHenry Calvin Martensen III 10/31/2015, 6:51 AM

## 2015-10-31 NOTE — Care Management Note (Signed)
Case Management Note  Patient Details  Name: Paul BoerDaniel Shah MRN: [id number] Date of Birth: 09-Nov-1990  Subjective/Objective:                 Admitted with R arm cellulitis, hx of IVDA. Resides with fiance, Paul Shah. Independent with ADL's and no DME usage PTA.  -s/p irrigation and debridement of RUE 10/29/2015  Action/Plan: Plan for pt to d/c to home today with outpatient follow up with Dr. Wandra Feinstein. Murphy on Monday  @ 10:15 am.   Expected Discharge Date:   10/31/2015           Expected Discharge Plan:  Home/Self Care  In-House Referral:     Discharge planning Services  CM Consult   Status of Service:  Completed, signed off  If discussed at Long Length of Stay Meetings, dates discussed:    Additional Comments:  Paul Shah, Paul Dunnam Hudson, RN 10/31/2015, 11:10 AM

## 2015-11-02 LAB — CULTURE, BLOOD (ROUTINE X 2): Culture: NO GROWTH

## 2015-11-02 NOTE — Discharge Summary (Signed)
Physician Discharge Summary  Paul Shah ZOX:[id number] DOB: 04-24-1990 DOA: 10/28/2015  PCP: No primary care provider on file.  Admit date: 10/28/2015 Discharge date: 10/31/2015  Time spent: 45 minutes  Recommendations for Outpatient Follow-up:  1. Dr.Tim Eulah Pont on Monday 10/23 at 10:15am   Discharge Diagnoses:  Principal Problem:   Right arm abscess   IV Heroin use   TOBACCO ABUSE   Abscess of arm   Sepsis (HCC)   Anxiety     Discharge Condition: stable Diet recommendation: regular  Filed Weights   10/28/15 1234 10/28/15 1939  Weight: 95.3 kg (210 lb) 94.8 kg (209 lb 1.6 oz)    History of present illness:  Pt. with PMH of IVDA; admitted on 10/28/2015, with complaint of right arm pain, was found to have right elbow abcess with polymyositis  Hospital Course:  1. Right arm abscess/cellulitis -s/p I &D 1/17 -at IVDA site -Blood Cx negative, wound Cx polymicrobial -Ortho following, improved, wound culture polymicrobial, discharged home on DOxycycline and Augmentin, will FU with Dr.Murphy on Monday  2. 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   Procedures: IRRIGATION AND DEBRIDEMENT of R arm abscess on 10/17  Consultations:  Ortho dr.Murphy  Discharge Exam: Vitals:   10/31/15 0524 10/31/15 0813  BP: (!) 142/85 136/86  Pulse: 86 81  Resp: 16   Temp: 97.7 F (36.5 C) 97.9 F (36.6 C)    General: AAox3 Cardiovascular: S1S2/RRR Respiratory: CTAB  Discharge Instructions   Discharge Instructions    Increase activity slowly    Complete by:  As directed      Discharge Medication List as of 10/31/2015 11:43 AM    START taking these medications   Details  acetaminophen (TYLENOL) 325 MG tablet Take 2 tablets (650 mg total) by mouth every 6 (six) hours as needed for mild pain or fever., Starting Thu 10/31/2015, OTC    amoxicillin-clavulanate (AUGMENTIN) 875-125 MG tablet Take 1 tablet by mouth 2 (two) times daily. For  10days, Starting Thu 10/31/2015, Print    doxycycline (VIBRAMYCIN) 50 MG capsule Take 2 capsules (100 mg total) by mouth 2 (two) times daily. For 10days, Starting Thu 10/31/2015, Print    traMADol (ULTRAM) 50 MG tablet Take 1 tablet (50 mg total) by mouth every 6 (six) hours as needed for moderate pain or severe pain., Starting Thu 10/31/2015, Print      CONTINUE these medications which have NOT CHANGED   Details  ibuprofen (ADVIL,MOTRIN) 200 MG tablet Take 200 mg by mouth every 6 (six) hours as needed for moderate pain., Historical Med       Allergies  Allergen Reactions  . Flexeril [Cyclobenzaprine] Hives and Shortness Of Breath  . Cefaclor     REACTION: rash  . Gadolinium Derivatives Itching    Itchy ears throat and legs   . Latex     REACTION: rash  . Topiramate Itching and Swelling    Swelling of nose and face. Numbness in legs and toes.    Follow-up Information    Sheral Apley, MD. Go on 11/04/2015.   Specialty:  Orthopedic Surgery Why:  Post hospital follow up  scheduled on 11/04/2015 at 10:15am with Dr. Wandra Feinstein. Contact information: [address] [place] [address] [phone]            The results of significant diagnostics from this hospitalization (including imaging, microbiology, ancillary and laboratory) are listed below for reference.    Significant Diagnostic Studies:  Dg Chest 1 View  Result Date: 10/28/2015 CLINICAL DATA:  Acute onset of right arm infection. Initial encounter. EXAM: CHEST 1 VIEW COMPARISON:  Chest radiograph performed 10/28/2009 FINDINGS: The lungs are well-aerated and clear. There is no evidence of focal opacification, pleural effusion or pneumothorax. The cardiomediastinal silhouette is within normal limits. No acute osseous abnormalities are seen. IMPRESSION: No acute cardiopulmonary process seen. Electronically Signed   By: Roanna RaiderJeffery  Chang M.D.   On: 10/28/2015 23:47   Mr Elbow Right W Wo  Contrast  Result Date: 10/29/2015 CLINICAL DATA:  Swelling, pain, redness, tenderness in the elbow. History of IV drug abuse. EXAM: MRI OF THE RIGHT ELBOW WITHOUT AND WITH CONTRAST TECHNIQUE: Multiplanar, multisequence MR imaging of the elbow was performed before and after the administration of intravenous contrast. CONTRAST:  20mL MULTIHANCE GADOBENATE DIMEGLUMINE 529 MG/ML IV SOLN COMPARISON:  None. FINDINGS: TENDONS Common forearm flexor origin: Intact. Common forearm extensor origin: Intact. Biceps: Intact. Triceps: Intact. LIGAMENTS Medial stabilizers: Intact. Lateral stabilizers:  Intact. Cartilage: No chondral defect. Joint: No joint effusion. Cubital tunnel: Normal cubital tunnel. Bones: No marrow signal abnormality.  No fracture or dislocation. Soft tissue: Circumferential soft tissue edema around the elbow and proximal forearm most severe along the volar aspect. In the subcutaneous fat at the level of the antecubital fossa there is a multiloculated complex fluid collection with thickened walls measuring 2.7 x 3.1 x 5.7 cm most concerning for abscess. Severe edema in the brachioradialis muscle with enhancement concerning for infectious myositis with a small fluid collection within the muscle consistent with an abscess. IMPRESSION: 1. Cellulitis circumferentially around the elbow and proximal forearm most severe along the volar aspect. Complex multiloculated fluid collection measuring 2.7 x 3.1 x 5.7 cm most consistent with abscess. 2. Severe edema in the brachioradialis muscle with enhancement and a small fluid collection concerning for pyomyositis. Electronically Signed   By: Elige KoHetal  Patel   On: 10/29/2015 09:49    Microbiology: Recent Results (from the past 240 hour(s))  Culture, blood (routine x 2)     Status: None   Collection Time: 10/28/15  3:00 PM  Result Value Ref Range Status   Specimen Description BLOOD LEFT HAND  Final   Special Requests BOTTLES DRAWN AEROBIC AND ANAEROBIC 5CC EACH   Final   Culture   Final    NO GROWTH 5 DAYS Performed at [facility]    Report Status 11/02/2015 FINAL  Final  Wound or Superficial Culture     Status: None   Collection Time: 10/28/15  4:17 PM  Result Value Ref Range Status   Specimen Description ABSCESS RIGHT ARM  Final   Special Requests NONE  Final   Gram Stain   Final    FEW WBC PRESENT, PREDOMINANTLY PMN FEW GRAM POSITIVE COCCI IN CLUSTERS IN PAIRS RARE GRAM NEGATIVE RODS Performed at [facility]    Culture MODERATE STREPTOCOCCUS GROUP F  Final   Report Status 10/31/2015 FINAL  Final  Surgical pcr screen     Status: Abnormal   Collection Time: 10/28/15 11:50 PM  Result Value Ref Range Status   MRSA, PCR NEGATIVE NEGATIVE Final   Staphylococcus aureus POSITIVE (A) NEGATIVE Final    Comment:        The Xpert SA Assay (FDA approved for NASAL specimens in patients over 26 years of age), is one component of a comprehensive surveillance program.  Test performance has been validated by [facility] for patients greater than or equal to 1 year  old. It is not intended to diagnose infection nor to guide or monitor treatment.   Aerobic/Anaerobic Culture (surgical/deep wound)     Status: None (Preliminary result)   Collection Time: 10/29/15  2:42 PM  Result Value Ref Range Status   Specimen Description ABSCESS RIGHT ARM  Final   Special Requests NONE  Final   Gram Stain   Final    ABUNDANT WBC PRESENT, PREDOMINANTLY PMN FEW GRAM NEGATIVE RODS FEW GRAM POSITIVE COCCI IN PAIRS AND CHAINS    Culture   Final    MODERATE STREPTOCOCCUS GROUP F MODERATE EIKENELLA CORRODENS NO ANAEROBES ISOLATED; CULTURE IN PROGRESS FOR 5 DAYS    Report Status PENDING  Incomplete     Labs: Basic Metabolic Panel:  Recent Labs Lab 10/28/15 1405 10/29/15 0717 10/30/15 0602  NA 136 139 137  K 3.8 4.6 3.8  CL 103 107 104  CO2 24 24 25   GLUCOSE 95 88 105*  BUN 15 6 <5*  CREATININE 0.97 0.91 0.85  CALCIUM 9.3 8.9 8.8*    Liver Function Tests:  Recent Labs Lab 10/28/15 1405  AST 16  ALT 13*  ALKPHOS 69  BILITOT 1.0  PROT 8.0  ALBUMIN 4.3   No results for input(s): LIPASE, AMYLASE in the last 168 hours. No results for input(s): AMMONIA in the last 168 hours. CBC:  Recent Labs Lab 10/28/15 1405 10/29/15 0717 10/30/15 0602  WBC 17.6* 15.2* 11.6*  NEUTROABS 13.0*  --  7.0  HGB 13.3 12.0* 11.6*  HCT 39.0 36.1* 35.2*  MCV 88.8 89.4 89.1  PLT 244 207 223   Cardiac Enzymes:  Recent Labs Lab 10/30/15 0602  CKTOTAL 84   BNP: BNP (last 3 results) No results for input(s): BNP in the last 8760 hours.  ProBNP (last 3 results) No results for input(s): PROBNP in the last 8760 hours.  CBG:  Recent Labs Lab 10/29/15 0813 10/30/15 0719 10/31/15 0753  GLUCAP 96 103* 109*       SignedZannie Cove MD.  [facility] 11/02/2015, 1:31 PM

## 2015-11-05 LAB — AEROBIC/ANAEROBIC CULTURE (SURGICAL/DEEP WOUND)

## 2015-11-05 LAB — AEROBIC/ANAEROBIC CULTURE W GRAM STAIN (SURGICAL/DEEP WOUND)

## 2016-03-29 ENCOUNTER — Encounter (HOSPITAL_COMMUNITY): Payer: Self-pay

## 2016-03-29 ENCOUNTER — Emergency Department (HOSPITAL_COMMUNITY)
Admission: EM | Admit: 2016-03-29 | Discharge: 2016-03-29 | Disposition: A | Discharge status: Home / Self Care | Payer: BLUE CROSS/BLUE SHIELD | Attending: Emergency Medicine | Admitting: Emergency Medicine

## 2016-03-29 DIAGNOSIS — Z0283 Encounter for blood-alcohol and blood-drug test: Secondary | ICD-10-CM | POA: Insufficient documentation

## 2016-03-29 DIAGNOSIS — F1021 Alcohol dependence, in remission: Secondary | ICD-10-CM

## 2016-03-29 DIAGNOSIS — Z79899 Other long term (current) drug therapy: Secondary | ICD-10-CM | POA: Insufficient documentation

## 2016-03-29 DIAGNOSIS — F1020 Alcohol dependence, uncomplicated: Secondary | ICD-10-CM | POA: Insufficient documentation

## 2016-03-29 DIAGNOSIS — F909 Attention-deficit hyperactivity disorder, unspecified type: Secondary | ICD-10-CM | POA: Insufficient documentation

## 2016-03-29 DIAGNOSIS — Z0489 Encounter for examination and observation for other specified reasons: Secondary | ICD-10-CM

## 2016-03-29 DIAGNOSIS — I10 Essential (primary) hypertension: Secondary | ICD-10-CM | POA: Insufficient documentation

## 2016-03-29 DIAGNOSIS — F17210 Nicotine dependence, cigarettes, uncomplicated: Secondary | ICD-10-CM | POA: Insufficient documentation

## 2016-03-29 DIAGNOSIS — Z91040 Latex allergy status: Secondary | ICD-10-CM | POA: Insufficient documentation

## 2016-03-29 LAB — COMPREHENSIVE METABOLIC PANEL
ALK PHOS: 67 U/L (ref 38–126)
ALT: 20 U/L (ref 17–63)
AST: 18 U/L (ref 15–41)
Albumin: 4 g/dL (ref 3.5–5.0)
Anion gap: 6 (ref 5–15)
BUN: 18 mg/dL (ref 6–20)
CALCIUM: 9.2 mg/dL (ref 8.9–10.3)
CO2: 26 mmol/L (ref 22–32)
CREATININE: 1.04 mg/dL (ref 0.61–1.24)
Chloride: 107 mmol/L (ref 101–111)
Glucose, Bld: 89 mg/dL (ref 65–99)
Potassium: 4.2 mmol/L (ref 3.5–5.1)
Sodium: 139 mmol/L (ref 135–145)
Total Bilirubin: 0.6 mg/dL (ref 0.3–1.2)
Total Protein: 6.4 g/dL — ABNORMAL LOW (ref 6.5–8.1)

## 2016-03-29 LAB — CBC
HCT: 42.5 % (ref 39.0–52.0)
Hemoglobin: 14.5 g/dL (ref 13.0–17.0)
MCH: 30.5 pg (ref 26.0–34.0)
MCHC: 34.1 g/dL (ref 30.0–36.0)
MCV: 89.5 fL (ref 78.0–100.0)
PLATELETS: 295 10*3/uL (ref 150–400)
RBC: 4.75 MIL/uL (ref 4.22–5.81)
RDW: 14.1 % (ref 11.5–15.5)
WBC: 13.9 10*3/uL — AB (ref 4.0–10.5)

## 2016-03-29 LAB — RAPID URINE DRUG SCREEN, HOSP PERFORMED
Amphetamines: NOT DETECTED
BARBITURATES: NOT DETECTED
BENZODIAZEPINES: NOT DETECTED
COCAINE: NOT DETECTED
OPIATES: NOT DETECTED
TETRAHYDROCANNABINOL: NOT DETECTED

## 2016-03-29 LAB — ETHANOL

## 2016-03-29 NOTE — ED Provider Notes (Signed)
[facility]-EMERGENCY DEPT Provider Note   CSN: [id number] Arrival date & time: 03/29/16  0217     History   Chief Complaint Chief Complaint  Patient presents with  . Medical Clearance    HPI Paul Shah is a 26 y.o. male with a PMHx of ADD, IV drug abuse, nephrolithiasis, HTN, anxiety, and depression, who presents to the ED for conclusive blood testing for alcohol use. Patient states [facility] from polysubstance abuse and alcoholism, and his urine drug and EtOH testing today was negative for illicit substances however it came back positive for alcohol therefore he was sent here for conclusive blood testing for alcohol level. He denies that he's had any illicit drug use or alcohol use in the last 3 months. He is not taking any medications. Denies SI/HI/AVH. Denies any medical complaints at this time and is only here for alcohol level testing. Of note, he brings in a letter reportedly from [facility], however it has very poor grammar and punctuation and is not signed by anyone, does not appear very professional.    The history is provided by the patient and medical records. No language interpreter was used.  Drug / Alcohol Assessment  Primary symptoms include no confusion, no weakness, no hallucinations, no self-injury, no intoxication. This is a chronic problem. The problem has been resolved. Suspected agents include alcohol. Pertinent negatives include no fever, no nausea and no vomiting. Associated medical issues include addiction treatment.    Past Medical History:  Diagnosis Date  . ADD (attention deficit disorder)   . Anxiety   . Depression   . IV drug abuse   . Kidney stone   . Renal disorder     Patient Active Problem List   Diagnosis Date Noted  . Abscess of arm 10/28/2015  . Right arm cellulitis 10/28/2015  . Sepsis (HCC) 10/28/2015  . Anxiety 10/28/2015  . GENERALIZED ANXIETY DISORDER 06/19/2009  . TOBACCO ABUSE 06/18/2009  .  HYPERTENSION 06/18/2009  . ALLERGIC RHINITIS 06/18/2009  . HEMOPTYSIS 06/18/2009    Past Surgical History:  Procedure Laterality Date  . DENTAL SURGERY    . I&D EXTREMITY Right 10/29/2015   Procedure: IRRIGATION AND DEBRIDEMENT RIGHT UPPER EXTREMITY;  Surgeon: Sheral Apleyimothy D Murphy, MD;  Location: [facility] OR;  Service: Orthopedics;  Laterality: Right;       Home Medications    Prior to Admission medications   Medication Sig Start Date End Date Taking? Authorizing Provider  acetaminophen (TYLENOL) 325 MG tablet Take 2 tablets (650 mg total) by mouth every 6 (six) hours as needed for mild pain or fever. 10/31/15   Zannie CovePreetha Joseph, MD  amoxicillin-clavulanate (AUGMENTIN) 875-125 MG tablet Take 1 tablet by mouth 2 (two) times daily. For 10days 10/31/15   Zannie CovePreetha Joseph, MD  doxycycline (VIBRAMYCIN) 50 MG capsule Take 2 capsules (100 mg total) by mouth 2 (two) times daily. For 10days 10/31/15   Zannie CovePreetha Joseph, MD  ibuprofen (ADVIL,MOTRIN) 200 MG tablet Take 200 mg by mouth every 6 (six) hours as needed for moderate pain.    Historical Provider, MD  traMADol (ULTRAM) 50 MG tablet Take 1 tablet (50 mg total) by mouth every 6 (six) hours as needed for moderate pain or severe pain. 10/31/15   Zannie CovePreetha Joseph, MD    Family History Family History  Problem Relation Age of Onset  . Heart attack Paternal Grandfather     Social History Social History  Substance Use Topics  . Smoking status: Current Every  Day Smoker    Types: Cigarettes  . Smokeless tobacco: Never Used  . Alcohol use No     Allergies   Flexeril [cyclobenzaprine]; Cefaclor; Gadolinium derivatives; Latex; and Topiramate   Review of Systems Review of Systems  Constitutional: Negative for chills and fever.  Respiratory: Negative for shortness of breath.   Cardiovascular: Negative for chest pain.  Gastrointestinal: Negative for abdominal pain, constipation, diarrhea, nausea and vomiting.  Genitourinary: Negative for dysuria and  hematuria.  Musculoskeletal: Negative for arthralgias and myalgias.  Skin: Negative for color change.  Allergic/Immunologic: Negative for immunocompromised state.  Neurological: Negative for weakness and numbness.  Psychiatric/Behavioral: Negative for confusion, hallucinations and self-injury.   10 Systems reviewed and are negative for acute change except as noted in the HPI.   Physical Exam Updated Vital Signs BP (!) 145/99 (BP Location: Left Arm)   Pulse 89   Temp 97.9 F (36.6 C) (Oral)   Resp 18   SpO2 100%   Physical Exam  Constitutional: He is oriented to person, place, and time. Vital signs are normal. He appears well-developed and well-nourished.  Non-toxic appearance. No distress.  Afebrile, nontoxic, NAD  HENT:  Head: Normocephalic and atraumatic.  Mouth/Throat: Oropharynx is clear and moist and mucous membranes are normal.  Eyes: Conjunctivae and EOM are normal. Right eye exhibits no discharge. Left eye exhibits no discharge.  Neck: Normal range of motion. Neck supple.  Cardiovascular: Normal rate, regular rhythm, normal heart sounds and intact distal pulses.  Exam reveals no gallop and no friction rub.   No murmur heard. Pulmonary/Chest: Effort normal and breath sounds normal. No respiratory distress. He has no decreased breath sounds. He has no wheezes. He has no rhonchi. He has no rales.  Abdominal: Soft. Normal appearance and bowel sounds are normal. He exhibits no distension. There is no tenderness. There is no rigidity, no rebound, no guarding, no CVA tenderness, no tenderness at McBurney's point and negative Murphy's sign.  Musculoskeletal: Normal range of motion.  Neurological: He is alert and oriented to person, place, and time. He has normal strength. No sensory deficit.  Skin: Skin is warm, dry and intact. No rash noted.  Psychiatric: He has a normal mood and affect. He is not actively hallucinating. He expresses no homicidal and no suicidal ideation. He  expresses no suicidal plans and no homicidal plans.  Calm and cooperative, denies SI/HI/AVH  Nursing note and vitals reviewed.    ED Treatments / Results  Labs (all labs ordered are listed, but only abnormal results are displayed) Labs Reviewed  COMPREHENSIVE METABOLIC PANEL - Abnormal; Notable for the following:       Result Value   Total Protein 6.4 (*)    All other components within normal limits  CBC - Abnormal; Notable for the following:    WBC 13.9 (*)    All other components within normal limits  ETHANOL  RAPID URINE DRUG SCREEN, HOSP PERFORMED    EKG  EKG Interpretation None       Radiology No results found.  Procedures Procedures (including critical care time)  Medications Ordered in ED Medications - No data to display   Initial Impression / Assessment and Plan / ED Course  I have reviewed the triage vital signs and the nursing notes.  Pertinent labs & imaging results that were available during my care of the patient were reviewed by me and considered in my medical decision making (see chart for details).     26 y.o. male here for  substance testing after [facility] tests were inconclusive/positive for EtOH. Very odd story, states he's in [facility] for recovery from addiction and they did urine testing on him, and no drugs were found but EtOH was found (odd that it would be a urine test?); so was sent here for conclusive blood testing. Has a letter reportedly from [facility] but it has very poor grammar, punctuation, and doesn't really appear professional as one would believe a letter of this nature would be. He denies illicit substance use or EtOH use in 3 months. Denies any other complaints at this time. CBC done in triage shows mildly elevated WBC 13.9 but otherwise unremarkable; EtOH, UDS, and CMP pending. Will reassess after these result.   3:55 AM CMP WNL. UDS without any substances detected. EtOH test undetectable. Testing negative, advised that  he f/up with the [facility] for ongoing recovery, continue to not drink or use drugs, and f/up with his primary provider for any further testing/management. I explained the findings and have given explicit precautions to return to the ER including for any other new or worsening symptoms. The patient understands and accepts the medical plan as it's been dictated and I have answered their questions. Discharge instructions concerning home care and prescriptions have been given. The patient is STABLE and is discharged to home in good condition.   Final Clinical Impressions(s) / ED Diagnoses   Final diagnoses:  Encounter for blood-alcohol test  Alcoholism in recovery [facility])    New Prescriptions New Prescriptions   No medications on file     [address] PA-C 03/29/16 0356    Gilda Crease, MD 03/29/16 [phone]

## 2016-03-29 NOTE — ED Notes (Signed)
Patient does not want to be placed in a gown or on the monitor at this time. PA and RN made aware.

## 2016-03-29 NOTE — Discharge Instructions (Addendum)
Your blood work and urine drug screen today were both negative, and your other labs were unremarkable. Please continue to follow up with the [facility] for your ongoing alcohol and drug recovery program. Continue to not use alcohol or drugs. Follow up with your regular doctor or provider for ongoing management of your medical care and any future testing you may need. Return to the ER for emergent changes or worsening symptoms.

## 2016-03-29 NOTE — ED Triage Notes (Signed)
Pt states requesting ETOH test. Pt states accepted [facility] for [facility]. Pt states needs ETOH test for acceptance. Pt denies any ETOH or drug use today. Pt denies any SI or HI. Pt with no complaints.

## 2016-11-22 IMAGING — CR DG CHEST 1V
1 series · 1 of 1 positions shown · non-contrast
Comparison: Chest radiograph performed 10/28/2009

CLINICAL DATA: Acute onset of right arm infection. Initial
encounter.

EXAM:
CHEST 1 VIEW

[chest pa]
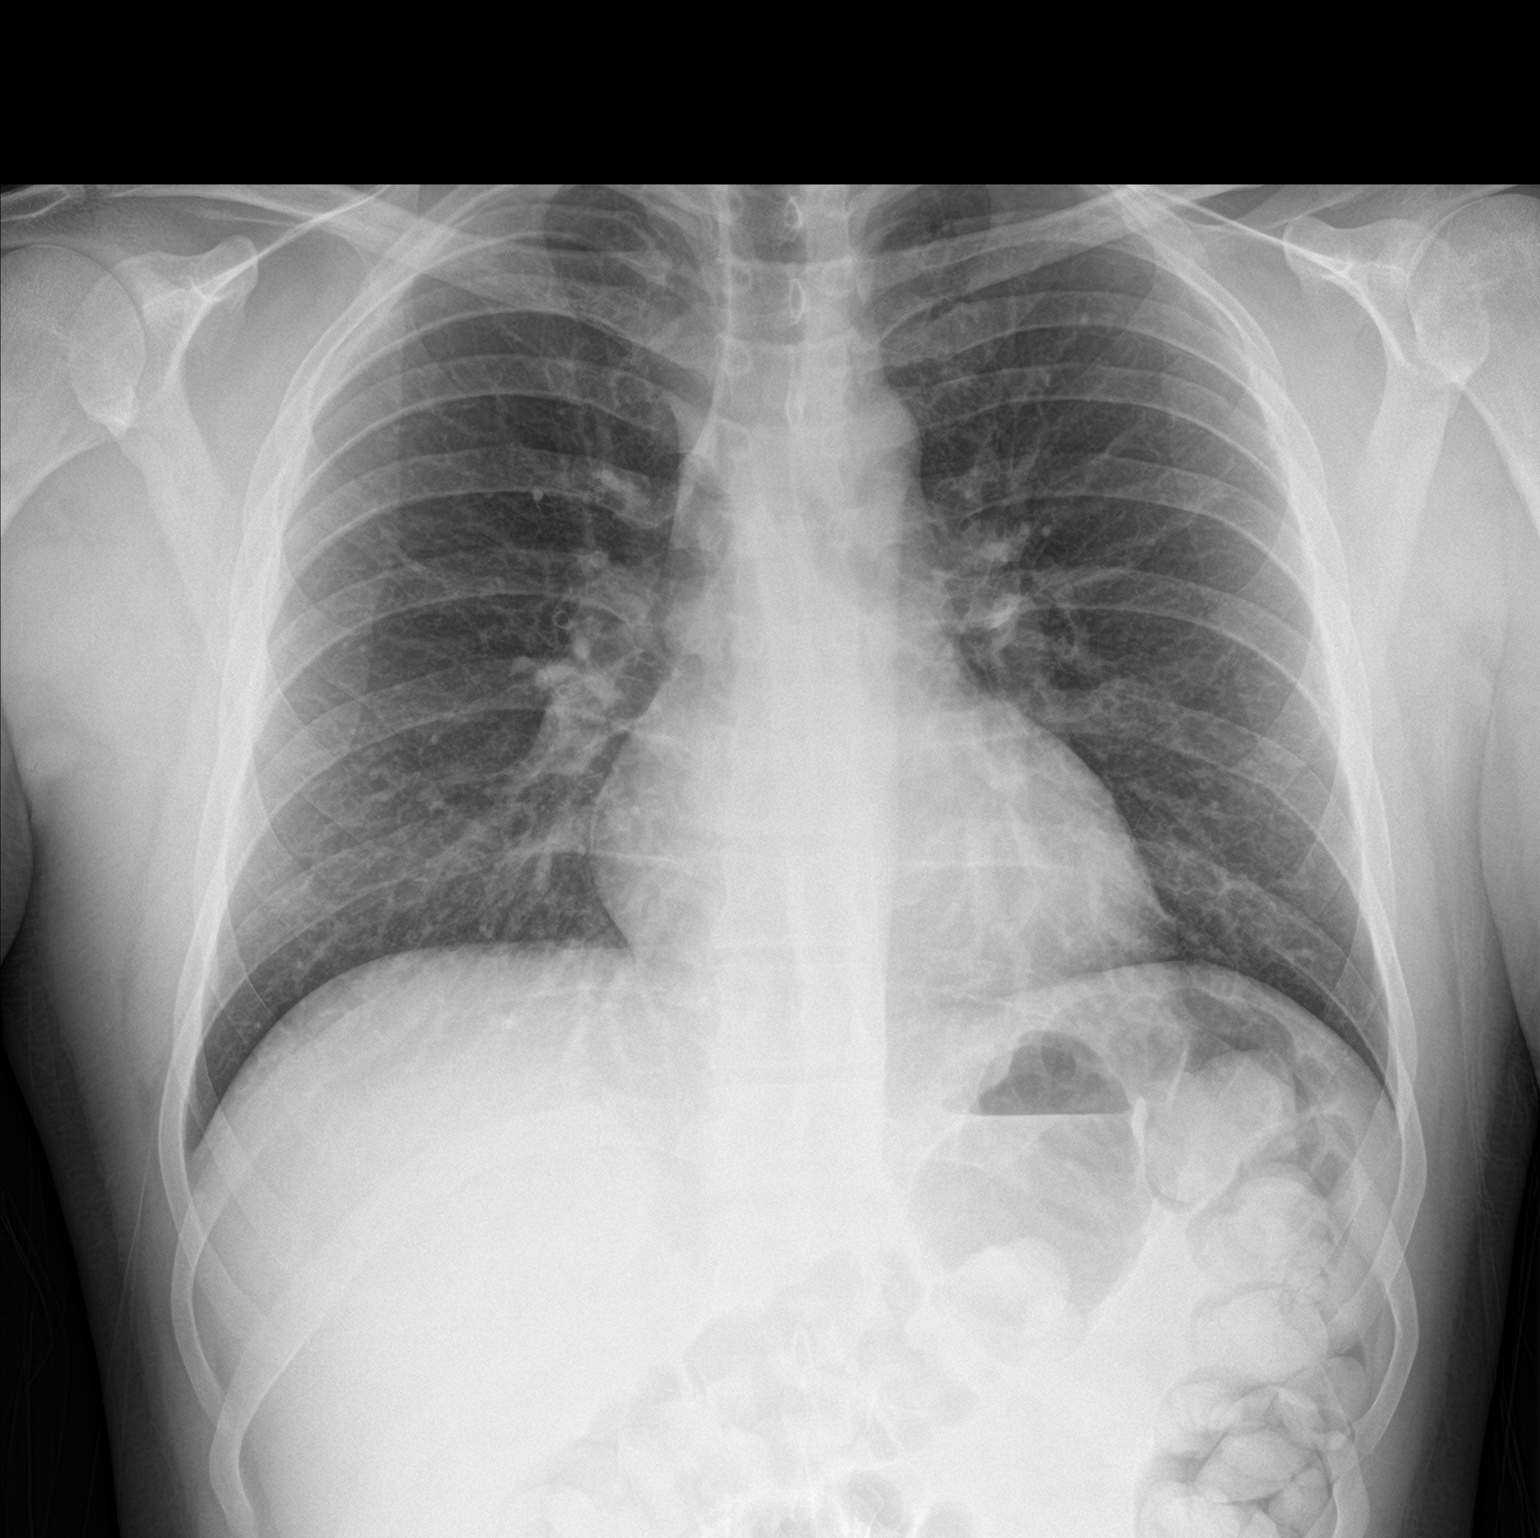

[1 of 1 positions shown; findings below may reference images not displayed]

FINDINGS: The lungs are well-aerated and clear. There is no evidence of focal
opacification, pleural effusion or pneumothorax.

The cardiomediastinal silhouette is within normal limits. No acute
osseous abnormalities are seen.
IMPRESSION: No acute cardiopulmonary process seen.

## 2022-02-09 ENCOUNTER — Encounter: Payer: Self-pay | Admitting: Internal Medicine

## 2022-02-09 ENCOUNTER — Ambulatory Visit: Payer: Medicaid Other | Admitting: Internal Medicine

## 2022-02-09 VITALS — BP 148/9 | HR 95 | Ht 73.0 in | Wt 203.8 lb

## 2022-02-09 DIAGNOSIS — I10 Essential (primary) hypertension: Secondary | ICD-10-CM

## 2022-02-09 DIAGNOSIS — R0609 Other forms of dyspnea: Secondary | ICD-10-CM

## 2022-02-09 DIAGNOSIS — R079 Chest pain, unspecified: Secondary | ICD-10-CM

## 2022-02-09 DIAGNOSIS — R002 Palpitations: Secondary | ICD-10-CM

## 2022-02-09 DIAGNOSIS — F1921 Other psychoactive substance dependence, in remission: Secondary | ICD-10-CM

## 2022-02-09 NOTE — Progress Notes (Signed)
Primary Physician/Referring:  Patient, No Pcp Per  Patient ID: Paul Shah, male    DOB: [birth year], 32 y.o.   MRN: [id number]  Chief Complaint  Patient presents with   early cad   family history of early CAD   Palpitations   HPI:    Paul Shah  is a 32 y.o. male with past medical history significant for IV drug abuse now clean for 2 years, depression, and alcohol dependence who is here to establish care with cardiology.  Patient has a significant family history of early heart disease on both sides of his family.  He has noticed chest pain, dyspnea on exertion, palpitations, and he is getting very worried about this.  Patient admits that the chest pain radiates down his left arm and he gets numbness and tingling in his fingertips.  He denies any recent fevers or illness and he has not injected for at least 2 years.  Patient has never been diagnosed with endocarditis in the past.  He has never had an echocardiogram in the past either.  He does admit that he is a very heavy drinker and he really feels like there is something wrong with his heart.  He has not had cardiac workup previously.  Of note at first he was clean for 5 years then relapsed for 3 months at which point he suffered cardiac arrest and since then he has been clean for 2 years.  Patient denies diaphoresis, syncope, edema, orthopnea, PND, claudication.  Past Medical History:  Diagnosis Date   ADD (attention deficit disorder)    Anxiety    Depression    IV drug abuse ([place])    Kidney stone    Renal disorder    Past Surgical History:  Procedure Laterality Date   arm surgery     DENTAL SURGERY     I & D EXTREMITY Right 10/29/2015   Procedure: IRRIGATION AND DEBRIDEMENT RIGHT UPPER EXTREMITY;  Surgeon: Renette Butters, MD;  Location: [facility];  Service: Orthopedics;  Laterality: Right;   Family History  Problem Relation Age of Onset   Heart attack Mother    Heart attack Father    Heart attack Paternal Grandfather      Social History   Tobacco Use   Smoking status: Every Day    Types: Cigarettes   Smokeless tobacco: Never  Substance Use Topics   Alcohol use: Yes   Marital Status: Single  ROS  Review of Systems  Cardiovascular:  Positive for chest pain, dyspnea on exertion, irregular heartbeat and palpitations.   Objective  Blood pressure (!) 148/9, pulse 95, height 6\' 1"  (1.854 m), weight 203 lb 12.8 oz (92.4 kg), SpO2 98 %. Body mass index is 26.89 kg/m.     02/09/2022    2:56 PM 02/09/2022    2:41 PM 03/29/2016    4:09 AM  Vitals with BMI  Height  6\' 1"    Weight  203 lbs 13 oz   BMI  66.44   Systolic 034 742 595  Diastolic 9 638 79  Pulse 95 106 81    Physical Exam Vitals reviewed.  HENT:     Head: Normocephalic and atraumatic.  Cardiovascular:     Rate and Rhythm: Regular rhythm. Tachycardia present.     Pulses: Normal pulses.     Heart sounds: Normal heart sounds.  Pulmonary:     Effort: Pulmonary effort is normal.     Breath sounds: Normal breath sounds.  Abdominal:  General: Bowel sounds are normal.  Musculoskeletal:     Right lower leg: No edema.     Left lower leg: No edema.  Skin:    General: Skin is warm and dry.  Neurological:     Mental Status: He is alert.     Medications and allergies   Allergies  Allergen Reactions   Flexeril [Cyclobenzaprine] Hives and Shortness Of Breath   Cefaclor     REACTION: rash   Gadolinium Derivatives Itching    Itchy ears throat and legs    Latex     REACTION: rash   Topiramate Itching and Swelling    Swelling of nose and face. Numbness in legs and toes.      Medication list after today's encounter   Current Outpatient Medications:    acetaminophen (TYLENOL) 325 MG tablet, Take 2 tablets (650 mg total) by mouth every 6 (six) hours as needed for mild pain or fever., Disp: , Rfl:    buprenorphine (SUBUTEX) 8 MG SUBL SL tablet, Place 16 mg under the tongue daily as needed., Disp: , Rfl:    ibuprofen (ADVIL,MOTRIN) 200  MG tablet, Take 200 mg by mouth every 6 (six) hours as needed for moderate pain., Disp: , Rfl:    oxazepam (SERAX) 15 MG capsule, Take 15 mg by mouth 3 (three) times daily as needed for sleep., Disp: , Rfl:   Laboratory examination:   Lab Results  Component Value Date   NA 139 03/29/2016   K 4.2 03/29/2016   CO2 26 03/29/2016   GLUCOSE 89 03/29/2016   BUN 18 03/29/2016   CREATININE 1.04 03/29/2016   CALCIUM 9.2 03/29/2016   GFRNONAA >60 03/29/2016       Latest Ref Rng & Units 03/29/2016    2:25 AM 10/30/2015    6:02 AM 10/29/2015    7:17 AM  CMP  Glucose 65 - 99 mg/dL 89  105  88   BUN 6 - 20 mg/dL 18  <5  6   Creatinine 0.61 - 1.24 mg/dL 1.04  0.85  0.91   Sodium 135 - 145 mmol/L 139  137  139   Potassium 3.5 - 5.1 mmol/L 4.2  3.8  4.6   Chloride 101 - 111 mmol/L 107  104  107   CO2 22 - 32 mmol/L 26  25  24    Calcium 8.9 - 10.3 mg/dL 9.2  8.8  8.9   Total Protein 6.5 - 8.1 g/dL 6.4     Total Bilirubin 0.3 - 1.2 mg/dL 0.6     Alkaline Phos 38 - 126 U/L 67     AST 15 - 41 U/L 18     ALT 17 - 63 U/L 20         Latest Ref Rng & Units 03/29/2016    2:25 AM 10/30/2015    6:02 AM 10/29/2015    7:17 AM  CBC  WBC 4.0 - 10.5 K/uL 13.9  11.6  15.2   Hemoglobin 13.0 - 17.0 g/dL 14.5  11.6  12.0   Hematocrit 39.0 - 52.0 % 42.5  35.2  36.1   Platelets 150 - 400 K/uL 295  223  207     Lipid Panel No results for input(s): "CHOL", "TRIG", "LDLCALC", "VLDL", "HDL", "CHOLHDL", "LDLDIRECT" in the last 8760 hours.  HEMOGLOBIN A1C No results found for: "HGBA1C", "MPG" TSH No results for input(s): "TSH" in the last 8760 hours.  External labs:     Radiology:    Cardiac  Studies:     EKG:   02/09/2022: Sinus Rhythm, normal axis. No evidence of ischemia.  Assessment     ICD-10-CM   1. Chest pain of uncertain etiology  R07.9 EKG 12-Lead    PCV ECHOCARDIOGRAM COMPLETE    2. Palpitations  R00.2 EKG 12-Lead    PCV ECHOCARDIOGRAM COMPLETE    3. Polysubstance dependence  in early, early partial, sustained full, or sustained partial remission (HCC)  F19.21 PCV ECHOCARDIOGRAM COMPLETE    4. DOE (dyspnea on exertion)  R06.09 PCV ECHOCARDIOGRAM COMPLETE    5. Tachycardia with hypertension  R00.0    I10        Orders Placed This Encounter  Procedures   EKG 12-Lead   PCV ECHOCARDIOGRAM COMPLETE    Standing Status:   Future    Standing Expiration Date:   02/10/2023    No orders of the defined types were placed in this encounter.   Medications Discontinued During This Encounter  Medication Reason   amoxicillin-clavulanate (AUGMENTIN) 875-125 MG tablet Completed Course   traMADol (ULTRAM) 50 MG tablet Completed Course   doxycycline (VIBRAMYCIN) 50 MG capsule Completed Course     Recommendations:   Paul Shah is a 32 y.o.  male with chest pain   Polysubstance dependence in early, early partial, sustained full, or sustained partial remission (HCC) Currently 2 years clean from IV drug abuse No history of endocarditis   DOE (dyspnea on exertion) Echocardiogram ordered Patient has sublingual nitro provided by his PCP Patient instructed not to do heavy lifting, heavy exertional activity, swimming until evaluation is complete.  Patient instructed to call if symptoms worse or to go to the ED for further evaluation.   Tachycardia with hypertension Palpitations Will start low-dose amlodipine 5 mg Follow-up in 1 to 2 months or sooner if needed    Clotilde Dieter, DO, [facility]  02/09/2022, 3:02 PM Office: [phone] Pager: [phone]

## 2022-02-10 ENCOUNTER — Ambulatory Visit: Payer: Medicaid Other

## 2022-02-10 DIAGNOSIS — R0609 Other forms of dyspnea: Secondary | ICD-10-CM

## 2022-02-10 DIAGNOSIS — R079 Chest pain, unspecified: Secondary | ICD-10-CM

## 2022-02-10 DIAGNOSIS — F1921 Other psychoactive substance dependence, in remission: Secondary | ICD-10-CM

## 2022-02-10 DIAGNOSIS — R002 Palpitations: Secondary | ICD-10-CM

## 2022-02-16 NOTE — Progress Notes (Signed)
Called patient, left  echocardiogram results on VM.

## 2022-03-23 ENCOUNTER — Ambulatory Visit: Payer: Medicaid Other | Admitting: Internal Medicine

## 2022-03-30 ENCOUNTER — Encounter: Payer: Self-pay | Admitting: Internal Medicine

## 2022-03-30 ENCOUNTER — Ambulatory Visit: Payer: Medicaid Other | Admitting: Internal Medicine

## 2022-03-30 VITALS — BP 121/89 | HR 95 | Resp 17 | Ht 73.0 in | Wt 205.0 lb

## 2022-03-30 DIAGNOSIS — R079 Chest pain, unspecified: Secondary | ICD-10-CM

## 2022-03-30 DIAGNOSIS — I10 Essential (primary) hypertension: Secondary | ICD-10-CM

## 2022-03-30 DIAGNOSIS — R002 Palpitations: Secondary | ICD-10-CM

## 2022-03-30 NOTE — Progress Notes (Signed)
Primary Physician/Referring:  Patient, No Pcp Per  Patient ID: Paul Shah, male    DOB: 1990-09-08, 32 y.o.   MRN: [id number]  Chief Complaint  Patient presents with   Chest Pain   Palpitations   Follow-up    6 weeks   HPI:    Paul Shah  is a 32 y.o. male with past medical history significant for IV drug abuse now clean for 2 years, depression, and alcohol dependence who is here for a follow-up visit. His other doctor put him back on clonidine and it seems to be helping his chest pain. He admits that he does not have a PCP and has not had blood work in many years. Patient would like to get his labs checked and he is looking for a PCP at this time. Otherwise, he has been doing pretty well since the last time he was here. Patient denies diaphoresis, syncope, edema, orthopnea, PND, claudication.  Past Medical History:  Diagnosis Date   ADD (attention deficit disorder)    Anxiety    Depression    IV drug abuse ([place])    Kidney stone    Renal disorder    Past Surgical History:  Procedure Laterality Date   arm surgery     DENTAL SURGERY     I & D EXTREMITY Right 10/29/2015   Procedure: IRRIGATION AND DEBRIDEMENT RIGHT UPPER EXTREMITY;  Surgeon: Renette Butters, MD;  Location: [facility];  Service: Orthopedics;  Laterality: Right;   Family History  Problem Relation Age of Onset   Heart attack Mother    Heart attack Father    Heart attack Paternal Grandfather     Social History   Tobacco Use   Smoking status: Every Day    Types: Cigarettes   Smokeless tobacco: Never  Substance Use Topics   Alcohol use: Yes    Comment: fifth of vodka a day   Marital Status: Single  ROS  Review of Systems  Cardiovascular:  Positive for chest pain and palpitations. Negative for dyspnea on exertion and irregular heartbeat.   Objective  Blood pressure 121/89, pulse 95, resp. rate 17, height 6\' 1"  (1.854 m), weight 205 lb (93 kg), SpO2 98 %. Body mass index is 27.05 kg/m.     03/30/2022     1:16 PM 02/09/2022    2:56 PM 02/09/2022    2:41 PM  Vitals with BMI  Height 6\' 1"   6\' 1"   Weight 205 lbs  203 lbs 13 oz  BMI [id number]  [id number]  Systolic 123XX123 [id number] 0000000  Diastolic 89 9 123456  Pulse 95 95 106    Physical Exam Vitals reviewed.  HENT:     Head: Normocephalic and atraumatic.  Cardiovascular:     Rate and Rhythm: Normal rate and regular rhythm.     Pulses: Normal pulses.     Heart sounds: Normal heart sounds.  Pulmonary:     Effort: Pulmonary effort is normal.     Breath sounds: Normal breath sounds.  Abdominal:     General: Bowel sounds are normal.  Musculoskeletal:     Right lower leg: No edema.     Left lower leg: No edema.  Skin:    General: Skin is warm and dry.  Neurological:     Mental Status: He is alert.     Medications and allergies   Allergies  Allergen Reactions   Flexeril [Cyclobenzaprine] Hives and Shortness Of Breath   Cefaclor     REACTION: rash  Gadolinium Derivatives Itching    Itchy ears throat and legs    Latex     REACTION: rash   Topiramate Itching and Swelling    Swelling of nose and face. Numbness in legs and toes.      Medication list after today's encounter   Current Outpatient Medications:    acetaminophen (TYLENOL) 325 MG tablet, Take 2 tablets (650 mg total) by mouth every 6 (six) hours as needed for mild pain or fever., Disp: , Rfl:    buprenorphine (SUBUTEX) 8 MG SUBL SL tablet, Place 16 mg under the tongue daily as needed., Disp: , Rfl:    cloNIDine (CATAPRES) 0.1 MG tablet, Take 0.1 mg by mouth 2 (two) times daily., Disp: , Rfl:    ibuprofen (ADVIL,MOTRIN) 200 MG tablet, Take 200 mg by mouth every 6 (six) hours as needed for moderate pain., Disp: , Rfl:    oxazepam (SERAX) 15 MG capsule, Take 15 mg by mouth 3 (three) times daily as needed for sleep., Disp: , Rfl:    Tadalafil 2.5 MG TABS, Take 1 tablet by mouth daily., Disp: , Rfl:   Laboratory examination:   Lab Results  Component Value Date   NA 139 03/29/2016   K  4.2 03/29/2016   CO2 26 03/29/2016   GLUCOSE 89 03/29/2016   BUN 18 03/29/2016   CREATININE 1.04 03/29/2016   CALCIUM 9.2 03/29/2016   GFRNONAA >60 03/29/2016       Latest Ref Rng & Units 03/29/2016    2:25 AM 10/30/2015    6:02 AM 10/29/2015    7:17 AM  CMP  Glucose 65 - 99 mg/dL 89  105  88   BUN 6 - 20 mg/dL 18  <5  6   Creatinine 0.61 - 1.24 mg/dL 1.04  0.85  0.91   Sodium 135 - 145 mmol/L 139  137  139   Potassium 3.5 - 5.1 mmol/L 4.2  3.8  4.6   Chloride 101 - 111 mmol/L 107  104  107   CO2 22 - 32 mmol/L 26  25  24    Calcium 8.9 - 10.3 mg/dL 9.2  8.8  8.9   Total Protein 6.5 - 8.1 g/dL 6.4     Total Bilirubin 0.3 - 1.2 mg/dL 0.6     Alkaline Phos 38 - 126 U/L 67     AST 15 - 41 U/L 18     ALT 17 - 63 U/L 20         Latest Ref Rng & Units 03/29/2016    2:25 AM 10/30/2015    6:02 AM 10/29/2015    7:17 AM  CBC  WBC 4.0 - 10.5 K/uL 13.9  11.6  15.2   Hemoglobin 13.0 - 17.0 g/dL 14.5  11.6  12.0   Hematocrit 39.0 - 52.0 % 42.5  35.2  36.1   Platelets 150 - 400 K/uL 295  223  207     Lipid Panel No results for input(s): "CHOL", "TRIG", "LDLCALC", "VLDL", "HDL", "CHOLHDL", "LDLDIRECT" in the last 8760 hours.  HEMOGLOBIN A1C No results found for: "HGBA1C", "MPG" TSH No results for input(s): "TSH" in the last 8760 hours.  External labs:     Radiology:    Cardiac Studies:   Echocardiogram 02/10/2022:  Normal LV systolic function with visual EF 60-65%. Left ventricle cavity  is normal in size. Normal left ventricular wall thickness. Normal global  wall motion. Normal diastolic filling pattern, normal LAP. Calculated EF  68%.  Structurally  normal tricuspid valve with no regurgitation. No evidence of  pulmonary hypertension.  No prior available for comparison.   EKG:   02/09/2022: Sinus Rhythm, normal axis. No evidence of ischemia.  Assessment     ICD-10-CM   1. Palpitations  R00.2 CBC    Hgb A1c w/o eAG    Lipid Panel With LDL/HDL Ratio    TSH     VITAMIN D 25 Hydroxy (Vit-D Deficiency, Fractures)    Magnesium    Hepatic function panel    CMP14+EGFR    2. Essential hypertension  I10 CBC    Hgb A1c w/o eAG    Lipid Panel With LDL/HDL Ratio    TSH    VITAMIN D 25 Hydroxy (Vit-D Deficiency, Fractures)    Magnesium    Hepatic function panel    CMP14+EGFR    3. Chest pain of uncertain etiology  [id number] CBC    Hgb A1c w/o eAG    Lipid Panel With LDL/HDL Ratio    TSH    VITAMIN D 25 Hydroxy (Vit-D Deficiency, Fractures)    Magnesium    Hepatic function panel    CMP14+EGFR       Orders Placed This Encounter  Procedures   CBC   Hgb A1c w/o eAG   Lipid Panel With LDL/HDL Ratio   TSH   VITAMIN D 25 Hydroxy (Vit-D Deficiency, Fractures)   Magnesium   Hepatic function panel   CMP14+EGFR    No orders of the defined types were placed in this encounter.   There are no discontinued medications.    Recommendations:   Jame Brimmer is a 32 y.o.  male with chest pain   Essential hypertension His other doctor put him back on clonidine and he is doing well on it Does not want to change meds at this time   Chest pain of uncertain etiology Echocardiogram within normal limits Chest pain has improved on clonidine He does not have a PCP and has not had blood work in a very long time Labs have been ordered and pt instructed to go to [facility]   Palpitations Will start low-dose amlodipine 5 mg Follow-up in 1 to 2 months or sooner if needed    Floydene Flock, DO, [facility]  03/30/2022, 1:51 PM Office: [phone] Pager: [phone]

## 2022-06-09 ENCOUNTER — Encounter: Payer: Self-pay | Admitting: Internal Medicine

## 2022-10-05 ENCOUNTER — Ambulatory Visit: Payer: Medicaid Other | Admitting: Cardiology

## 2023-02-08 ENCOUNTER — Ambulatory Visit (HOSPITAL_COMMUNITY)
Admission: EM | Admit: 2023-02-08 | Discharge: 2023-02-08 | Discharge status: Against Medical Advice | Payer: Medicaid Other

## 2023-02-08 NOTE — Progress Notes (Signed)
   02/08/23 1654  [facility] Triage Screening (Walk-ins at [facility] only)  How Did You Hear About [place]? Family/Friend  What Is the Reason for Your Visit/Call Today? Lehner is a 33 year old male presenting to [facility] accompanied by his partner. Pt is looking for alcohol detox. Pt mentions he has recently been diagnosed with liver disease. Pt reports he drinks a 5th per day. Pt reports he has been drinking for roughly 2 years. Pt mentions he drank over a pint of alcohol today. Pt reports he beleives he is withdrawling at this time. Pt reports he is taking suboxin and claondine at this time. Pt is looking to be admitted at this time of triage. Pt denies drug use, Si, Hi and Avh.  How Long Has This Been Causing You Problems? > than 6 months  Have You Recently Had Any Thoughts About Hurting Yourself? No  Are You Planning to Commit Suicide/Harm Yourself At This time? No  Have you Recently Had Thoughts About Hurting Someone Karolee Ohs? No  Are You Planning To Harm Someone At This Time? No  Physical Abuse Denies  Verbal Abuse Denies  Sexual Abuse Denies  Exploitation of patient/patient's resources Denies  Self-Neglect Denies  Possible abuse reported to: Other (Comment)  Are you currently experiencing any auditory, visual or other hallucinations? No  Have You Used Any Alcohol or Drugs in the Past 24 Hours? Yes  What Did You Use and How Much? a pint today  Do you have any current medical co-morbidities that require immediate attention? No  Clinician description of patient physical appearance/behavior: anxious, cooperative  What Do You Feel Would Help You the Most Today? Medication(s)  If access to [facility] was not available, would you have sought care in the Emergency Department? No  Determination of Need Urgent (48 hours)  Options For Referral Medication Management  Determination of Need filed? Yes
